# Patient Record
Sex: Male | Born: 1960 | Race: White | Hispanic: No | Marital: Single | Smoking: Never smoker
Health system: Southern US, Community
[De-identification: ages and names within clinical notes are randomized; demographics above are authoritative.]

## PROBLEM LIST (undated history)

## (undated) DIAGNOSIS — I10 Essential (primary) hypertension: Secondary | ICD-10-CM

## (undated) DIAGNOSIS — F32A Depression, unspecified: Secondary | ICD-10-CM

## (undated) DIAGNOSIS — E079 Disorder of thyroid, unspecified: Secondary | ICD-10-CM

## (undated) DIAGNOSIS — F329 Major depressive disorder, single episode, unspecified: Secondary | ICD-10-CM

## (undated) DIAGNOSIS — E785 Hyperlipidemia, unspecified: Secondary | ICD-10-CM

## (undated) DIAGNOSIS — F1010 Alcohol abuse, uncomplicated: Secondary | ICD-10-CM

## (undated) DIAGNOSIS — C801 Malignant (primary) neoplasm, unspecified: Secondary | ICD-10-CM

## (undated) DIAGNOSIS — F419 Anxiety disorder, unspecified: Secondary | ICD-10-CM

## (undated) HISTORY — DX: Depression, unspecified: F32.A

## (undated) HISTORY — DX: Essential (primary) hypertension: I10

## (undated) HISTORY — DX: Major depressive disorder, single episode, unspecified: F32.9

## (undated) HISTORY — DX: Hyperlipidemia, unspecified: E78.5

## (undated) HISTORY — PX: THYROIDECTOMY: SHX17

## (undated) HISTORY — DX: Anxiety disorder, unspecified: F41.9

---

## 1997-08-02 ENCOUNTER — Encounter: Admission: RE | Admit: 1997-08-02 | Discharge: 1997-08-02 | Payer: Self-pay | Admitting: Sports Medicine

## 1997-10-28 ENCOUNTER — Encounter: Admission: RE | Admit: 1997-10-28 | Discharge: 1997-10-28 | Payer: Self-pay | Admitting: Family Medicine

## 1997-11-24 ENCOUNTER — Encounter: Admission: RE | Admit: 1997-11-24 | Discharge: 1997-11-24 | Payer: Self-pay | Admitting: Family Medicine

## 1997-11-28 ENCOUNTER — Encounter: Admission: RE | Admit: 1997-11-28 | Discharge: 1997-11-28 | Payer: Self-pay | Admitting: Family Medicine

## 1997-11-29 ENCOUNTER — Encounter: Admission: RE | Admit: 1997-11-29 | Discharge: 1997-11-29 | Payer: Self-pay | Admitting: Family Medicine

## 1997-12-27 ENCOUNTER — Encounter: Admission: RE | Admit: 1997-12-27 | Discharge: 1997-12-27 | Payer: Self-pay | Admitting: Sports Medicine

## 1998-09-12 ENCOUNTER — Encounter: Admission: RE | Admit: 1998-09-12 | Discharge: 1998-09-12 | Payer: Self-pay | Admitting: Family Medicine

## 1998-09-14 ENCOUNTER — Encounter: Admission: RE | Admit: 1998-09-14 | Discharge: 1998-09-14 | Payer: Self-pay | Admitting: Family Medicine

## 1998-11-20 ENCOUNTER — Encounter: Admission: RE | Admit: 1998-11-20 | Discharge: 1998-11-20 | Payer: Self-pay | Admitting: Family Medicine

## 1999-08-17 ENCOUNTER — Encounter: Admission: RE | Admit: 1999-08-17 | Discharge: 1999-08-17 | Payer: Self-pay | Admitting: Family Medicine

## 1999-09-15 ENCOUNTER — Emergency Department (HOSPITAL_COMMUNITY): Admission: EM | Admit: 1999-09-15 | Discharge: 1999-09-15 | Payer: Self-pay | Admitting: Emergency Medicine

## 1999-09-16 ENCOUNTER — Encounter: Payer: Self-pay | Admitting: Emergency Medicine

## 1999-09-19 ENCOUNTER — Encounter: Admission: RE | Admit: 1999-09-19 | Discharge: 1999-09-19 | Payer: Self-pay | Admitting: Family Medicine

## 1999-11-16 ENCOUNTER — Encounter: Admission: RE | Admit: 1999-11-16 | Discharge: 2000-02-14 | Payer: Self-pay | Admitting: Internal Medicine

## 2004-05-08 ENCOUNTER — Ambulatory Visit: Payer: Self-pay | Admitting: Endocrinology

## 2004-07-20 ENCOUNTER — Ambulatory Visit: Payer: Self-pay | Admitting: Endocrinology

## 2005-01-20 ENCOUNTER — Emergency Department (HOSPITAL_COMMUNITY): Admission: EM | Admit: 2005-01-20 | Discharge: 2005-01-20 | Payer: Self-pay | Admitting: Family Medicine

## 2006-09-19 ENCOUNTER — Observation Stay (HOSPITAL_COMMUNITY): Admission: EM | Admit: 2006-09-19 | Discharge: 2006-09-20 | Payer: Self-pay | Admitting: Emergency Medicine

## 2007-01-24 ENCOUNTER — Emergency Department (HOSPITAL_COMMUNITY): Admission: EM | Admit: 2007-01-24 | Discharge: 2007-01-24 | Payer: Self-pay | Admitting: Emergency Medicine

## 2007-02-22 ENCOUNTER — Emergency Department (HOSPITAL_COMMUNITY): Admission: EM | Admit: 2007-02-22 | Discharge: 2007-02-22 | Payer: Self-pay | Admitting: Family Medicine

## 2008-07-21 ENCOUNTER — Emergency Department (HOSPITAL_COMMUNITY): Admission: EM | Admit: 2008-07-21 | Discharge: 2008-07-21 | Payer: Self-pay | Admitting: Emergency Medicine

## 2008-09-04 ENCOUNTER — Emergency Department (HOSPITAL_COMMUNITY): Admission: EM | Admit: 2008-09-04 | Discharge: 2008-09-04 | Payer: Self-pay | Admitting: Emergency Medicine

## 2008-09-08 ENCOUNTER — Emergency Department (HOSPITAL_COMMUNITY): Admission: EM | Admit: 2008-09-08 | Discharge: 2008-09-08 | Payer: Self-pay | Admitting: Family Medicine

## 2008-09-10 ENCOUNTER — Emergency Department (HOSPITAL_COMMUNITY): Admission: EM | Admit: 2008-09-10 | Discharge: 2008-09-10 | Payer: Self-pay | Admitting: Emergency Medicine

## 2008-10-21 ENCOUNTER — Emergency Department (HOSPITAL_COMMUNITY): Admission: EM | Admit: 2008-10-21 | Discharge: 2008-10-21 | Payer: Self-pay | Admitting: Emergency Medicine

## 2010-01-28 ENCOUNTER — Encounter: Payer: Self-pay | Admitting: Internal Medicine

## 2010-05-22 NOTE — H&P (Signed)
NAME:  Richard Duarte, Richard Duarte NO.:  [id number]   MEDICAL RECORD NO.:  [id number]          PATIENT TYPE:  EMS   LOCATION:  MAJO                         FACILITY:  [facility]   PHYSICIAN:  Mobolaji B. Bakare, M.D.DATE OF BIRTH:  [birth year]   DATE OF ADMISSION:  09/19/2006  DATE OF DISCHARGE:                              HISTORY & PHYSICAL   PRIMARY CARE PHYSICIAN:  Robert P. Merla Riches, M.D., Urgent [facility].   CHIEF COMPLAINT:  Bilateral thigh cramps and weakness.   HISTORY OF PRESENT ILLNESS:  Mr. Maye is a 50 year old Caucasian  male who was in his usual state of health until late last night at about  12 midnight when he developed cramps in both thighs.  It felt like a  Charlie horse.  He was playing video games at that time.  These cramps  were not getting any better.  He attempted to walk.  He had difficulty  standing up from a sitting position.  The patient decided to come to the  emergency room early this morning.   He had an MRI of his lumbar spine which showed very small right  paracentral herniated nucleus pulposus involving L5-S1.  There was no  root compression.  Laboratory workup done so far showed hypokalemia of  2.7.  He has a normal AST and ALT, mildly elevated creatinine kinase of  280.  The patient has not had similar problem in the past.  He has not  had diarrhea, vomiting.  He saw Dr. Merla Riches 10 days ago, and he drew  some blood work which he has not received the results of as of yet.  The  patient has been seen by Dr. Sandria Manly, neurologist.   REVIEW OF SYSTEMS:  There is no fever, chills, shortness of breath,  chest pain, cough, nausea, vomiting, diarrhea.  No dysuria, urgency, or  increased frequency of micturition.   PAST MEDICAL HISTORY:  1. Thyroidectomy in 1993 for thyroid cancer.  He has subsequently been      on thyroid replacement therapy.  2. Hypertension.  3. Hyperlipidemia.  4. Morbid obesity.  5. The patient has been taken off  antihypertensive and cholesterol      medications since he lost 85 pounds since October of 2007.  He was      Atkins diet and exercise.  Currently the patient is no longer      exercising regularly.   CURRENT MEDICATIONS:  Synthroid.  The patient cannot recall the dose.   ALLERGIES:  PENICILLIN.   FAMILY HISTORY:  Father is deceased possibly from heart trouble.  Mother  is alive.  He has one other sister sibling.   SOCIAL HISTORY:  He does not smoke cigarettes.  He states that he drinks  alcohol in moderation.  He is unemployed at this time.  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   PHYSICAL EXAMINATION:  VITAL SIGNS:  Initial vitals revealed a  temperature of 97.3, blood pressure 139/98, pulse 95, respiratory rate  22, O2 saturation 98%.  GENERAL:  The patient is awake, alert, and oriented to time,  place, and  person.  HEENT:  Normocephalic, atraumatic.  Pupils are equal, round, and  reactive to light.  Extraocular muscle movements intact.  Mucous  membranes moist.  NECK:  No elevated JVD.  No carotid bruit.  LUNGS:  Clear clinically to auscultation.  CVS:  S1 and S2 regular.  No murmur, no gallop.  ABDOMEN:  Not distended, soft, nontender.  Bowel sounds present.  EXTREMITIES:  No pedal edema or calf tenderness.  CNS:  Motor power 5/5 in both upper limbs, 4/5 involving the quadriceps  bilaterally.  There is no loss of sensation.  Cranial nerves are intact.  The patient was able to ambulate but slowly.  Coordination appears to be  intact.   INITIAL LABORATORY DATA:  Potassium 2.7.  CK 280, ALT 25.  White cells  8.0, hemoglobin 14.7, hematocrit 41.9, MCV 85.4, platelets 199, normal  differential.  Sodium 142, chloride 109, glucose __________, creatinine  1.0, bicarb 23.   ASSESSMENT AND PLAN:  Mr. Mergen is a pleasant 50 year old Caucasian  male presenting with muscle cramps and weakness involving both thighs.  He was noted to have  hypokalemia of 2.7.  MRI of the lumbar spine showed  very small right herniated nucleus pulposus which I do not think is  relevant to current symptoms.  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  The etiology of hypokalemia is unclear.   ADMISSION DIAGNOSES:  1. Hypokalemia.  2. Proximal myopathy.  3. Hypothyroidism.   PLAN:  The patient has received 20 mEq of potassium IV and 40 mEq p.o.  Will repeat potassium now and also check magnesium.  Will continue to  replete potassium as necessary.  Will ask physical therapist to evaluate  and see for improvement in symptoms after repleting potassium.  Will  observe the patient on telemetry.      Mobolaji B. Corky Downs, M.D.  Electronically Signed     MBB/MEDQ  D:  09/19/2006  T:  09/19/2006  Job:  [id number]

## 2010-05-22 NOTE — Consult Note (Signed)
NAME:  Richard Duarte, Richard Duarte NO.:  [id number]   MEDICAL RECORD NO.:  [id number]          PATIENT TYPE:  EMS   LOCATION:  MAJO                         FACILITY:  [facility]   PHYSICIAN:  Richard Duarte, M.D.    DATE OF BIRTH:  16-Jun-1960   DATE OF CONSULTATION:  09/19/2006  DATE OF DISCHARGE:                                 CONSULTATION   PRIMARY CARE PHYSICIAN:  Richard Duarte, M.D.   This 50 year old right-handed white single male is seen in the emergency  room at [facility] on September 19, 2006 for evaluation of  bilateral lower extremity proximal pain and weakness.   HISTORY OF PRESENT ILLNESS:  Richard Duarte has a prior history of  thyroid cancer in 1988 and underwent removal followed by radiation  therapy and chemotherapy.  He has been on Synthroid replacement 0.15 mg  daily but no other medications.  He has been losing weight at his  physician's request and has lost approximately 85 pounds over the last  year.  He has been a low-carbohydrate diet but does drink two glass of  wine every other day.  On the evening of September 17, 2006 to celebrate  a friend's birthday, he did eat a high carbohydrate load.  On the  evening/morning of September 17, 2006 to September 18, 2006, he noted  the onset of pain proximally in his thighs when he stood up.  This was a  severe cramping pain, lasting minutes to as long as 1 hour.  Since that  time, has noted weakness and difficulty in standing and walking in his  lower extremities.  He has had no neck pain radiating into his arms nor  has he had any lower back pain radiating to his legs.  He denies any  numbness or bowel or bladder dysfunction.  He has no family history of  neuromyopathic disease.  He denies any use of drugs.  As mentioned, he  does drink two glass of wine every other day.   ALLERGIES:  PENICILLIN.   SOCIAL HISTORY:  He is single.  He has no children.  He was educated  with a BA degree in [place].   He is currently unemployed.   FAMILY HISTORY:  His mother is 27 and has Alzheimer's disease and  otherwise is in good health.  His father died at age 61 from myocardial  infarction.  He has one sister who is 9, living and well.   OBJECTIVE:  GENERAL:  Well-developed, mildly obese white male.  VITAL SIGNS:  Blood pressure in the right and left arm was 120/80, heart  rate 64.  NECK:  There were no carotid or supraclavicular bruits heard.  The neck  flexion/extension maneuvers were relatively unremarkable.  NEUROLOGIC:  Mental status:  He was alert and oriented x3.  His cranial nerve  examination revealed visual fields to be full.  Both disks were seen and  flat.  The extraocular movements were full.  Corneals were present.  There was no seventh nerve palsy.  Hearing was present.  Air conduction  was greater than bone conduction.  Tongue was midline.  The uvula was  midline.  Gags were present.  Sternocleidomastoid and trapezius testing  were normal.  Motor examination revealed some giving way phenomenon in  the triceps muscles bilaterally secondary to pain, possibly in the 4+/5  range in terms of strength.  Otherwise, strength testing in the upper  extremities was quite good.  In the lower extremities, he had weakness  in the iliopsoas bilaterally in the 4-/5 range, some weakness in the  hamstrings bilaterally and in the gluteus maximus bilaterally.  The  gluteus medius and distal muscle strength in the lower extremities  appeared to be quite good.  Deep tendon reflexes were 2+ in the upper  and lower extremities.  Plantar responses were downgoing.  Sensory  examination was intact to pinprick touch.  Normal position and vibration  testing.  His two-point discrimination in the hands was equal.  Gait was  not evaluated.   LABORATORY DATA:  White blood cell count of 8000, hemoglobin 14.7,  hematocrit 41.9, platelet count 199 K.  Sodium 142, potassium 2.8,  chloride 109, BUN 6, glucose  134.  His pH was 7.437.  CK total was 280,  SGPT was 25.   IMPRESSION:  1. Suspect myopathy most likely secondary to hypokalemia, code 359.8.  2. Hypokalemia, code 276.8.  3. History of thyroid cancer, code 193.   RECOMMENDATION:  Recommendations at this time is if MRI study of lumbar  spine is unremarkable to replace his potassium.           ______________________________  Richard Duarte, M.D.     JML/MEDQ  D:  09/19/2006  T:  09/19/2006  Job:  [id number]   cc:   Richard Duarte, M.D.  Richard Duarte, M.D.

## 2010-07-06 ENCOUNTER — Emergency Department (HOSPITAL_COMMUNITY)
Admission: EM | Admit: 2010-07-06 | Discharge: 2010-07-07 | Discharge status: Against Medical Advice | Payer: BC Managed Care – PPO | Attending: Emergency Medicine | Admitting: Emergency Medicine

## 2010-07-06 DIAGNOSIS — Z0389 Encounter for observation for other suspected diseases and conditions ruled out: Secondary | ICD-10-CM | POA: Insufficient documentation

## 2010-09-27 LAB — I-STAT 8, (EC8 V) (CONVERTED LAB)
Acid-base deficit: 1
BUN: 14
Bicarbonate: 22.1
Chloride: 106
Glucose, Bld: 105 — ABNORMAL HIGH
HCT: 49
Hemoglobin: 16.7
Operator id: 270961
Potassium: 3.6
Sodium: 139
TCO2: 23
pCO2, Ven: 33 — ABNORMAL LOW
pH, Ven: 7.434 — ABNORMAL HIGH

## 2010-09-27 LAB — POCT I-STAT CREATININE
Creatinine, Ser: 1
Operator id: 270961

## 2010-10-06 ENCOUNTER — Inpatient Hospital Stay (INDEPENDENT_AMBULATORY_CARE_PROVIDER_SITE_OTHER)
Admission: RE | Admit: 2010-10-06 | Discharge: 2010-10-06 | Disposition: A | Discharge status: Home / Self Care | Payer: BC Managed Care – PPO | Source: Ambulatory Visit | Attending: Family Medicine | Admitting: Family Medicine

## 2010-10-06 DIAGNOSIS — F411 Generalized anxiety disorder: Secondary | ICD-10-CM

## 2010-10-06 DIAGNOSIS — E870 Hyperosmolality and hypernatremia: Secondary | ICD-10-CM

## 2010-10-06 DIAGNOSIS — R002 Palpitations: Secondary | ICD-10-CM

## 2010-10-06 LAB — POCT I-STAT, CHEM 8
BUN: 18 mg/dL (ref 6–23)
Calcium, Ion: 1.13 mmol/L (ref 1.12–1.32)
Chloride: 102 mEq/L (ref 96–112)
Creatinine, Ser: 1 mg/dL (ref 0.50–1.35)
Glucose, Bld: 84 mg/dL (ref 70–99)
HCT: 57 % — ABNORMAL HIGH (ref 39.0–52.0)
Hemoglobin: 19.4 g/dL — ABNORMAL HIGH (ref 13.0–17.0)
Potassium: 4.2 mEq/L (ref 3.5–5.1)
Sodium: 157 mEq/L — ABNORMAL HIGH (ref 135–145)
TCO2: 18 mmol/L (ref 0–100)

## 2010-10-07 ENCOUNTER — Emergency Department (HOSPITAL_COMMUNITY): Payer: BC Managed Care – PPO

## 2010-10-07 ENCOUNTER — Emergency Department (HOSPITAL_COMMUNITY)
Admission: EM | Admit: 2010-10-07 | Discharge: 2010-10-07 | Disposition: A | Discharge status: Home / Self Care | Payer: BC Managed Care – PPO | Attending: Emergency Medicine | Admitting: Emergency Medicine

## 2010-10-07 DIAGNOSIS — E860 Dehydration: Secondary | ICD-10-CM | POA: Insufficient documentation

## 2010-10-07 DIAGNOSIS — I10 Essential (primary) hypertension: Secondary | ICD-10-CM | POA: Insufficient documentation

## 2010-10-07 DIAGNOSIS — E785 Hyperlipidemia, unspecified: Secondary | ICD-10-CM | POA: Insufficient documentation

## 2010-10-07 DIAGNOSIS — R002 Palpitations: Secondary | ICD-10-CM | POA: Insufficient documentation

## 2010-10-07 DIAGNOSIS — E039 Hypothyroidism, unspecified: Secondary | ICD-10-CM | POA: Insufficient documentation

## 2010-10-07 DIAGNOSIS — Z85850 Personal history of malignant neoplasm of thyroid: Secondary | ICD-10-CM | POA: Insufficient documentation

## 2010-10-07 DIAGNOSIS — F411 Generalized anxiety disorder: Secondary | ICD-10-CM | POA: Insufficient documentation

## 2010-10-07 LAB — URINALYSIS, ROUTINE W REFLEX MICROSCOPIC
Bilirubin Urine: NEGATIVE
Glucose, UA: NEGATIVE mg/dL
Ketones, ur: >80 mg/dL — AB
Protein, ur: NEGATIVE mg/dL
pH: 5.5 (ref 5.0–8.0)

## 2010-10-07 LAB — POCT I-STAT, CHEM 8
BUN: 24 mg/dL — ABNORMAL HIGH (ref 6–23)
Creatinine, Ser: 1 mg/dL (ref 0.50–1.35)
Glucose, Bld: 102 mg/dL — ABNORMAL HIGH (ref 70–99)
Hemoglobin: 16 g/dL (ref 13.0–17.0)
Potassium: 3.7 mEq/L (ref 3.5–5.1)
TCO2: 22 mmol/L (ref 0–100)

## 2010-10-07 LAB — URINE MICROSCOPIC-ADD ON

## 2010-10-07 LAB — CBC
HCT: 44.8 % (ref 39.0–52.0)
Hemoglobin: 16.1 g/dL (ref 13.0–17.0)
MCH: 30.1 pg (ref 26.0–34.0)
MCHC: 35.9 g/dL (ref 30.0–36.0)
MCV: 83.9 fL (ref 78.0–100.0)
RDW: 12.6 % (ref 11.5–15.5)

## 2010-10-07 LAB — DIFFERENTIAL
Basophils Absolute: 0 10*3/uL (ref 0.0–0.1)
Eosinophils Relative: 2 % (ref 0–5)
Lymphocytes Relative: 32 % (ref 12–46)
Monocytes Absolute: 0.6 10*3/uL (ref 0.1–1.0)
Monocytes Relative: 9 % (ref 3–12)

## 2010-10-19 LAB — CBC
Hemoglobin: 14.7
MCHC: 35.2
Platelets: 214
RBC: 4.91
RDW: 13.5
WBC: 7.1
WBC: 8

## 2010-10-19 LAB — PHOSPHORUS
Phosphorus: 2.2 — ABNORMAL LOW
Phosphorus: 2.3

## 2010-10-19 LAB — SEDIMENTATION RATE: Sed Rate: 10

## 2010-10-19 LAB — I-STAT 8, (EC8 V) (CONVERTED LAB)
Acid-base deficit: 1
Bicarbonate: 22.7
Glucose, Bld: 134 — ABNORMAL HIGH
Hemoglobin: 15
Potassium: 2.8 — ABNORMAL LOW
Sodium: 142
TCO2: 24
pH, Ven: 7.437 — ABNORMAL HIGH

## 2010-10-19 LAB — TSH: TSH: 2.521

## 2010-10-19 LAB — DIFFERENTIAL
Eosinophils Absolute: 0.1
Lymphs Abs: 1.5
Monocytes Relative: 6
Neutro Abs: 5.9
Neutrophils Relative %: 74

## 2010-10-19 LAB — POCT I-STAT CREATININE
Creatinine, Ser: 1
Operator id: 294501

## 2010-10-19 LAB — ALT: ALT: 25

## 2010-10-19 LAB — MAGNESIUM: Magnesium: 1.7

## 2010-10-19 LAB — CK: Total CK: 280 — ABNORMAL HIGH

## 2011-05-11 ENCOUNTER — Emergency Department (HOSPITAL_COMMUNITY)
Admission: EM | Admit: 2011-05-11 | Discharge: 2011-05-11 | Disposition: A | Discharge status: Home / Self Care | Payer: BC Managed Care – PPO | Source: Home / Self Care

## 2011-05-11 NOTE — ED Notes (Signed)
Pt is orient x 3 and speaking full sentences without any difficulty breathing. Pt stated he started having chest pain with driving to [facility]. However, Pt states he has no chest pain at the current time and has no cardiac history. Pt is coming to today due to taking his own BP pressure at home and getting low readings of  Systolic 90. Pt returned to waiting room to wait his turn to be called and informed if anything else changes to let us know.

## 2011-11-17 ENCOUNTER — Encounter (HOSPITAL_COMMUNITY): Payer: Self-pay | Admitting: *Deleted

## 2011-11-17 ENCOUNTER — Emergency Department (HOSPITAL_COMMUNITY)
Admission: EM | Admit: 2011-11-17 | Discharge: 2011-11-17 | Disposition: A | Discharge status: Home / Self Care | Payer: BC Managed Care – PPO | Source: Home / Self Care | Attending: Family Medicine | Admitting: Family Medicine

## 2011-11-17 DIAGNOSIS — F419 Anxiety disorder, unspecified: Secondary | ICD-10-CM

## 2011-11-17 DIAGNOSIS — F411 Generalized anxiety disorder: Secondary | ICD-10-CM

## 2011-11-17 DIAGNOSIS — T180XXA Foreign body in mouth, initial encounter: Secondary | ICD-10-CM

## 2011-11-17 HISTORY — DX: Malignant (primary) neoplasm, unspecified: C80.1

## 2011-11-17 HISTORY — DX: Disorder of thyroid, unspecified: E07.9

## 2011-11-17 HISTORY — DX: Alcohol abuse, uncomplicated: F10.10

## 2011-11-17 MED ORDER — LORAZEPAM 0.5 MG PO TABS: 0.5000 mg | ORAL_TABLET | Freq: Three times a day (TID) | ORAL | Status: DC | PRN

## 2011-11-17 NOTE — ED Provider Notes (Signed)
History     CSN: [id number]  Arrival date & time 11/17/11  1478   First MD Initiated Contact with Patient 11/17/11 [phone]      Chief Complaint  Patient presents with  . Ingestion    (Consider location/radiation/quality/duration/timing/severity/associated sxs/prior treatment) HPI Comments: 51 year old male with history of anxiety and alcoholism here complaining of a biter taste in his mouth after biting a capsule that was inserted in in his breakfast this morning. Patient reports that he eats breakfast from same [employer] every day. He took his food home and was eating his breakfast table when he bit into a liquid gel capsule and the content was spilled in his mouth causing him to have a very bitter taste. He immediately spit out all mouth content. He was able to see a cream rubber capsule and some brown liquid coming out of it. After this incident he went to the [employer] file a complaint and then came here. He still has some residual bitter taste in his mouth. Feels anxious, denies nausea although he's been trying to induce vomit himself. Denies enies any other symptoms like salivation, sweating, dizziness, headache, blurred vision, chest discomfort, difficulty breathing, abdominal pain, vomiting or diarrhea. Also no rash or flushing.   Past Medical History  Diagnosis Date  . Thyroid disease   . Cancer     Thyroid  . Alcohol abuse, daily use     Past Surgical History  Procedure Date  . Thyroidectomy     No family history on file.  History  Substance Use Topics  . Smoking status: Never Smoker   . Smokeless tobacco: Not on file  . Alcohol Use: No     Comment: Quit      Review of Systems  Constitutional: Negative for fever, chills, diaphoresis, activity change, appetite change and fatigue.  HENT: Negative for congestion, sore throat, facial swelling, sneezing, drooling and trouble swallowing.   Eyes: Negative for redness, itching and visual disturbance.    Respiratory: Negative for cough, chest tightness, shortness of breath and wheezing.   Cardiovascular: Negative for chest pain.  Gastrointestinal: Negative for nausea, vomiting, abdominal pain and diarrhea.  Musculoskeletal: Negative for myalgias and arthralgias.  Skin: Negative for rash.  Neurological: Negative for dizziness and headaches.  Psychiatric/Behavioral: The patient is nervous/anxious.     Allergies  Penicillins  Home Medications   Current Outpatient Rx  Name  Route  Sig  Dispense  Refill  . VITAMIN C PO   Oral   Take by mouth.         . ASPIRIN 81 MG PO TABS   Oral   Take 81 mg by mouth daily.         Marland Kitchen LEVOTHYROXINE SODIUM 175 MCG PO TABS   Oral   Take 175 mcg by mouth daily.         Marland Kitchen MAGNESIUM PO   Oral   Take by mouth.         Marland Kitchen MILK THISTLE EXTRACT PO   Oral   Take by mouth.         . ONE-DAILY MULTI VITAMINS PO TABS   Oral   Take 1 tablet by mouth daily.         Marland Kitchen ZINC PO   Oral   Take by mouth.         . RED YEAST RICE PO   Oral   Take by mouth.         Marland Kitchen LORAZEPAM 0.5 MG  PO TABS   Oral   Take 1 tablet (0.5 mg total) by mouth every 8 (eight) hours as needed for anxiety.   6 tablet   0     BP 146/97  Pulse 115  Temp 98 F (36.7 C) (Oral)  Resp 26  SpO2 97%  Physical Exam  Nursing note and vitals reviewed. Constitutional: He is oriented to person, place, and time. He appears well-developed and well-nourished.       Appears anxious  HENT:  Head: Normocephalic and atraumatic.  Mouth/Throat: Oropharynx is clear and moist. No oropharyngeal exudate.       No discoloration of the teeth, tongue or mouth mucosa  Eyes: Conjunctivae normal and EOM are normal. Pupils are equal, round, and reactive to light. Right eye exhibits no discharge. Left eye exhibits no discharge. No scleral icterus.  Neck: Neck supple.  Cardiovascular: Normal rate, regular rhythm and normal heart sounds.        Tachycardic  Pulmonary/Chest:  Effort normal and breath sounds normal. No respiratory distress. He has no wheezes. He has no rales. He exhibits no tenderness.       Patient was breathing comfortably with normal respiratory rate during my exam.  Abdominal: Soft. He exhibits no distension. There is no tenderness.  Neurological: He is alert and oriented to person, place, and time.  Skin: No rash noted. He is not diaphoretic.  Psychiatric: He has a normal mood and affect. His behavior is normal. Judgment and thought content normal.    ED Course  Procedures (including critical care time)  Labs Reviewed - No data to display No results found.   1. Anxiety   2. Foreign body in mouth       MDM  Discussed patient's case with poison control. The medications that come in liquid gel form (moslty cold remedies) in [place] does not poise a life threat to the patient in question even if the capsule was fully ingested.  The capsule that the patient ingested does not have any obvious prints. It is possible there is a no labeled multivitamin or herbal supplement or a toy. There there are no clinical symptoms or findings suggestive of acute poison.   poison control does not recommend any testing at this point.   patient was instructed to go to the emergency department if new symptoms including but not limited to salivation, nausea, vomiting, dizziness, headache, difficulty breathing, abdominal or chest discomfort, feeling flushed or rashes. I prescribed Ativan 0.5 mg 3 times a day when necessary for anxiety.         Sharin Grave, MD 11/18/11 1332

## 2011-11-17 NOTE — ED Notes (Addendum)
States was eating a breakfast bowl from [employer] at home when he suddenly bit into a liquigel that came from the egg - immediately had very bitter taste in his mouth, spit out the dark green oblong casing.  Went to [facility], but Production designer, theatre/television/film had no answers.  Patient states he's very anxious about this - patient shaky and visibly anxious, continues to try to vomit - told to no longer do so.  States has not had anything to eat or drink since incident.  Reports slight HA, "But that's normal for me this time of year".  Denies any other c/o's.  Large dark green casing without any markings noted in bowl.

## 2012-11-03 ENCOUNTER — Ambulatory Visit
Admission: RE | Admit: 2012-11-03 | Discharge: 2012-11-03 | Disposition: A | Discharge status: Home / Self Care | Payer: BC Managed Care – PPO | Source: Ambulatory Visit | Attending: Nurse Practitioner | Admitting: Nurse Practitioner

## 2012-11-03 ENCOUNTER — Other Ambulatory Visit: Payer: Self-pay | Admitting: Nurse Practitioner

## 2012-11-03 DIAGNOSIS — M545 Low back pain, unspecified: Secondary | ICD-10-CM

## 2013-04-30 ENCOUNTER — Other Ambulatory Visit: Payer: Self-pay | Admitting: Internal Medicine

## 2013-04-30 ENCOUNTER — Ambulatory Visit
Admission: RE | Admit: 2013-04-30 | Discharge: 2013-04-30 | Disposition: A | Discharge status: Home / Self Care | Payer: BC Managed Care – PPO | Source: Ambulatory Visit | Attending: Internal Medicine | Admitting: Internal Medicine

## 2013-04-30 DIAGNOSIS — R079 Chest pain, unspecified: Secondary | ICD-10-CM

## 2014-03-20 ENCOUNTER — Encounter (HOSPITAL_COMMUNITY): Payer: Self-pay | Admitting: Emergency Medicine

## 2014-03-20 ENCOUNTER — Emergency Department (HOSPITAL_COMMUNITY)
Admission: EM | Admit: 2014-03-20 | Discharge: 2014-03-20 | Disposition: A | Discharge status: Home / Self Care | Payer: BLUE CROSS/BLUE SHIELD | Source: Home / Self Care | Attending: Family Medicine | Admitting: Family Medicine

## 2014-03-20 DIAGNOSIS — J40 Bronchitis, not specified as acute or chronic: Secondary | ICD-10-CM

## 2014-03-20 MED ORDER — PREDNISONE 10 MG PO TABS: 30.0000 mg | ORAL_TABLET | Freq: Every day | ORAL | Status: DC

## 2014-03-20 MED ORDER — IPRATROPIUM BROMIDE 0.06 % NA SOLN: 2.0000 | Freq: Four times a day (QID) | NASAL | Status: DC

## 2014-03-20 MED ORDER — AZITHROMYCIN 250 MG PO TABS
250.0000 mg | ORAL_TABLET | Freq: Every day | ORAL | Status: DC
Start: 2014-03-20 — End: 2016-01-14

## 2014-03-20 MED ORDER — HYDROCOD POLST-CHLORPHEN POLST 10-8 MG/5ML PO LQCR: 5.0000 mL | Freq: Two times a day (BID) | ORAL | Status: DC | PRN

## 2014-03-20 NOTE — Discharge Instructions (Signed)
Thank you for coming in today. Call or go to the emergency room if you get worse, have trouble breathing, have chest pains, or palpitations.  Do not drive after taking cough medicine.  Return as needed.  Take the azithromycin antibiotics if not better.   Acute Bronchitis Bronchitis is inflammation of the airways that extend from the windpipe into the lungs (bronchi). The inflammation often causes mucus to develop. This leads to a cough, which is the most common symptom of bronchitis.  In acute bronchitis, the condition usually develops suddenly and goes away over time, usually in a couple weeks. Smoking, allergies, and asthma can make bronchitis worse. Repeated episodes of bronchitis may cause further lung problems.  CAUSES Acute bronchitis is most often caused by the same virus that causes a cold. The virus can spread from person to person (contagious) through coughing, sneezing, and touching contaminated objects. SIGNS AND SYMPTOMS   Cough.   Fever.   Coughing up mucus.   Body aches.   Chest congestion.   Chills.   Shortness of breath.   Sore throat.  DIAGNOSIS  Acute bronchitis is usually diagnosed through a physical exam. Your health care provider will also ask you questions about your medical history. Tests, such as chest X-rays, are sometimes done to rule out other conditions.  TREATMENT  Acute bronchitis usually goes away in a couple weeks. Oftentimes, no medical treatment is necessary. Medicines are sometimes given for relief of fever or cough. Antibiotic medicines are usually not needed but may be prescribed in certain situations. In some cases, an inhaler may be recommended to help reduce shortness of breath and control the cough. A cool mist vaporizer may also be used to help thin bronchial secretions and make it easier to clear the chest.  HOME CARE INSTRUCTIONS  Get plenty of rest.   Drink enough fluids to keep your urine clear or pale yellow (unless you have  a medical condition that requires fluid restriction). Increasing fluids may help thin your respiratory secretions (sputum) and reduce chest congestion, and it will prevent dehydration.   Take medicines only as directed by your health care provider.  If you were prescribed an antibiotic medicine, finish it all even if you start to feel better.  Avoid smoking and secondhand smoke. Exposure to cigarette smoke or irritating chemicals will make bronchitis worse. If you are a smoker, consider using nicotine gum or skin patches to help control withdrawal symptoms. Quitting smoking will help your lungs heal faster.   Reduce the chances of another bout of acute bronchitis by washing your hands frequently, avoiding people with cold symptoms, and trying not to touch your hands to your mouth, nose, or eyes.   Keep all follow-up visits as directed by your health care provider.  SEEK MEDICAL CARE IF: Your symptoms do not improve after 1 week of treatment.  SEEK IMMEDIATE MEDICAL CARE IF:  You develop an increased fever or chills.   You have chest pain.   You have severe shortness of breath.  You have bloody sputum.   You develop dehydration.  You faint or repeatedly feel like you are going to pass out.  You develop repeated vomiting.  You develop a severe headache. MAKE SURE YOU:   Understand these instructions.  Will watch your condition.  Will get help right away if you are not doing well or get worse. Document Released: 02/01/2004 Document Revised: 05/10/2013 Document Reviewed: 06/16/2012 [facility] Patient Information 2015 [place], [place]. This information is not intended to  replace advice given to you by your health care provider. Make sure you discuss any questions you have with your health care provider. ° °

## 2014-03-20 NOTE — ED Notes (Signed)
C/o  A prolonged cough since 3/4.  Pt states that symptoms started on 3/4 with sore throat and runny noses.   On Sunday cough started.  Monday was seen by primary and given cough meds, but is having no relief.  Cough is nonproductive.  Denies fever n/v/d.  No chest pain or sob.

## 2014-03-20 NOTE — ED Provider Notes (Signed)
Richard Duarte is a 54 y.o. male who presents to Urgent Care today for cough. Patient notes a 1 week history of cough and congestion. He was seen by his PCP 6 days ago and was prescribed tessonex which has helped. He notes that the cough continues. The cough is minimally productive. The cough is quite bothersome despite cough syrup. He notes runny nose as well. No vomiting diarrhea chest pain or palpitations. No significant shortness of breath.   Past Medical History  Diagnosis Date  . Thyroid disease   . Cancer     Thyroid  . Alcohol abuse, daily use    Past Surgical History  Procedure Laterality Date  . Thyroidectomy     History  Substance Use Topics  . Smoking status: Never Smoker   . Smokeless tobacco: Not on file  . Alcohol Use: No     Comment: Quit   ROS as above Medications: No current facility-administered medications for this encounter.   Current Outpatient Prescriptions  Medication Sig Dispense Refill  . HYDROcodone-acetaminophen (NORCO/VICODIN) 5-325 MG per tablet Take 1 tablet by mouth every 6 (six) hours as needed for moderate pain.    Marland Kitchen levothyroxine (SYNTHROID, LEVOTHROID) 175 MCG tablet Take 175 mcg by mouth daily.    . Ascorbic Acid (VITAMIN C PO) Take by mouth.    Marland Kitchen aspirin 81 MG tablet Take 81 mg by mouth daily.    Marland Kitchen azithromycin (ZITHROMAX) 250 MG tablet Take 1 tablet (250 mg total) by mouth daily. Take first 2 tablets together, then 1 every day until finished. 6 tablet 0  . chlorpheniramine-HYDROcodone (TUSSIONEX) 10-8 MG/5ML LQCR Take 5 mLs by mouth every 12 (twelve) hours as needed for cough. 115 mL 0  . ipratropium (ATROVENT) 0.06 % nasal spray Place 2 sprays into both nostrils 4 (four) times daily. 15 mL 1  . LORazepam (ATIVAN) 0.5 MG tablet Take 1 tablet (0.5 mg total) by mouth every 8 (eight) hours as needed for anxiety. 6 tablet 0  . MAGNESIUM PO Take by mouth.    Marland Kitchen MILK THISTLE EXTRACT PO Take by mouth.    . Multiple Vitamin (MULTIVITAMIN) tablet  Take 1 tablet by mouth daily.    . Multiple Vitamins-Minerals (ZINC PO) Take by mouth.    . predniSONE (DELTASONE) 10 MG tablet Take 3 tablets (30 mg total) by mouth daily. 15 tablet 0  . Red Yeast Rice Extract (RED YEAST RICE PO) Take by mouth.     Allergies  Allergen Reactions  . Amoxicillin   . Keflex [Cephalexin] Hives  . Penicillins      Exam:  BP 136/88 mmHg  Pulse 90  Temp(Src) 98.2 F (36.8 C) (Oral)  Resp 18  SpO2 95% Gen: Well NAD HEENT: EOMI,  MMM clear nasal discharge. Posterior pharynx cobblestoning. Normal tympanic membranes bilaterally. Lungs: Normal work of breathing. CTABL Heart: RRR no MRG Abd: NABS, Soft. Nondistended, Nontender Exts: Brisk capillary refill, warm and well perfused.   No results found for this or any previous visit (from the past 24 hour(s)). No results found.  Assessment and Plan: 54 y.o. male with bronchitis likely viral. Treat with prednisone, Atrovent nasal spray, Tussionex. Use azithromycin if not improving or worsening.  Discussed warning signs or symptoms. Please see discharge instructions. Patient expresses understanding.     Gregor Hams, MD 03/20/14 [phone]

## 2014-05-05 ENCOUNTER — Other Ambulatory Visit: Payer: Self-pay | Admitting: Internal Medicine

## 2014-05-05 ENCOUNTER — Ambulatory Visit
Admission: RE | Admit: 2014-05-05 | Discharge: 2014-05-05 | Disposition: A | Discharge status: Home / Self Care | Payer: BLUE CROSS/BLUE SHIELD | Source: Ambulatory Visit | Attending: Internal Medicine | Admitting: Internal Medicine

## 2014-05-05 DIAGNOSIS — M25461 Effusion, right knee: Secondary | ICD-10-CM

## 2015-04-13 DIAGNOSIS — J069 Acute upper respiratory infection, unspecified: Secondary | ICD-10-CM | POA: Diagnosis not present

## 2015-07-26 DIAGNOSIS — E890 Postprocedural hypothyroidism: Secondary | ICD-10-CM | POA: Diagnosis not present

## 2015-07-26 DIAGNOSIS — Z85850 Personal history of malignant neoplasm of thyroid: Secondary | ICD-10-CM | POA: Diagnosis not present

## 2015-07-28 DIAGNOSIS — Z85850 Personal history of malignant neoplasm of thyroid: Secondary | ICD-10-CM | POA: Diagnosis not present

## 2015-07-28 DIAGNOSIS — E890 Postprocedural hypothyroidism: Secondary | ICD-10-CM | POA: Diagnosis not present

## 2015-09-14 DIAGNOSIS — Z85850 Personal history of malignant neoplasm of thyroid: Secondary | ICD-10-CM | POA: Diagnosis not present

## 2015-09-14 DIAGNOSIS — E890 Postprocedural hypothyroidism: Secondary | ICD-10-CM | POA: Diagnosis not present

## 2015-11-12 IMAGING — CR DG KNEE COMPLETE 4+V*R*
3 series · 3 of 3 positions shown · non-contrast
Comparison: None.

CLINICAL DATA: Pain following fall 1 week prior. Swelling and
bruising medially

EXAM:
RIGHT KNEE - COMPLETE 4+ VIEW

[w knee ap left]
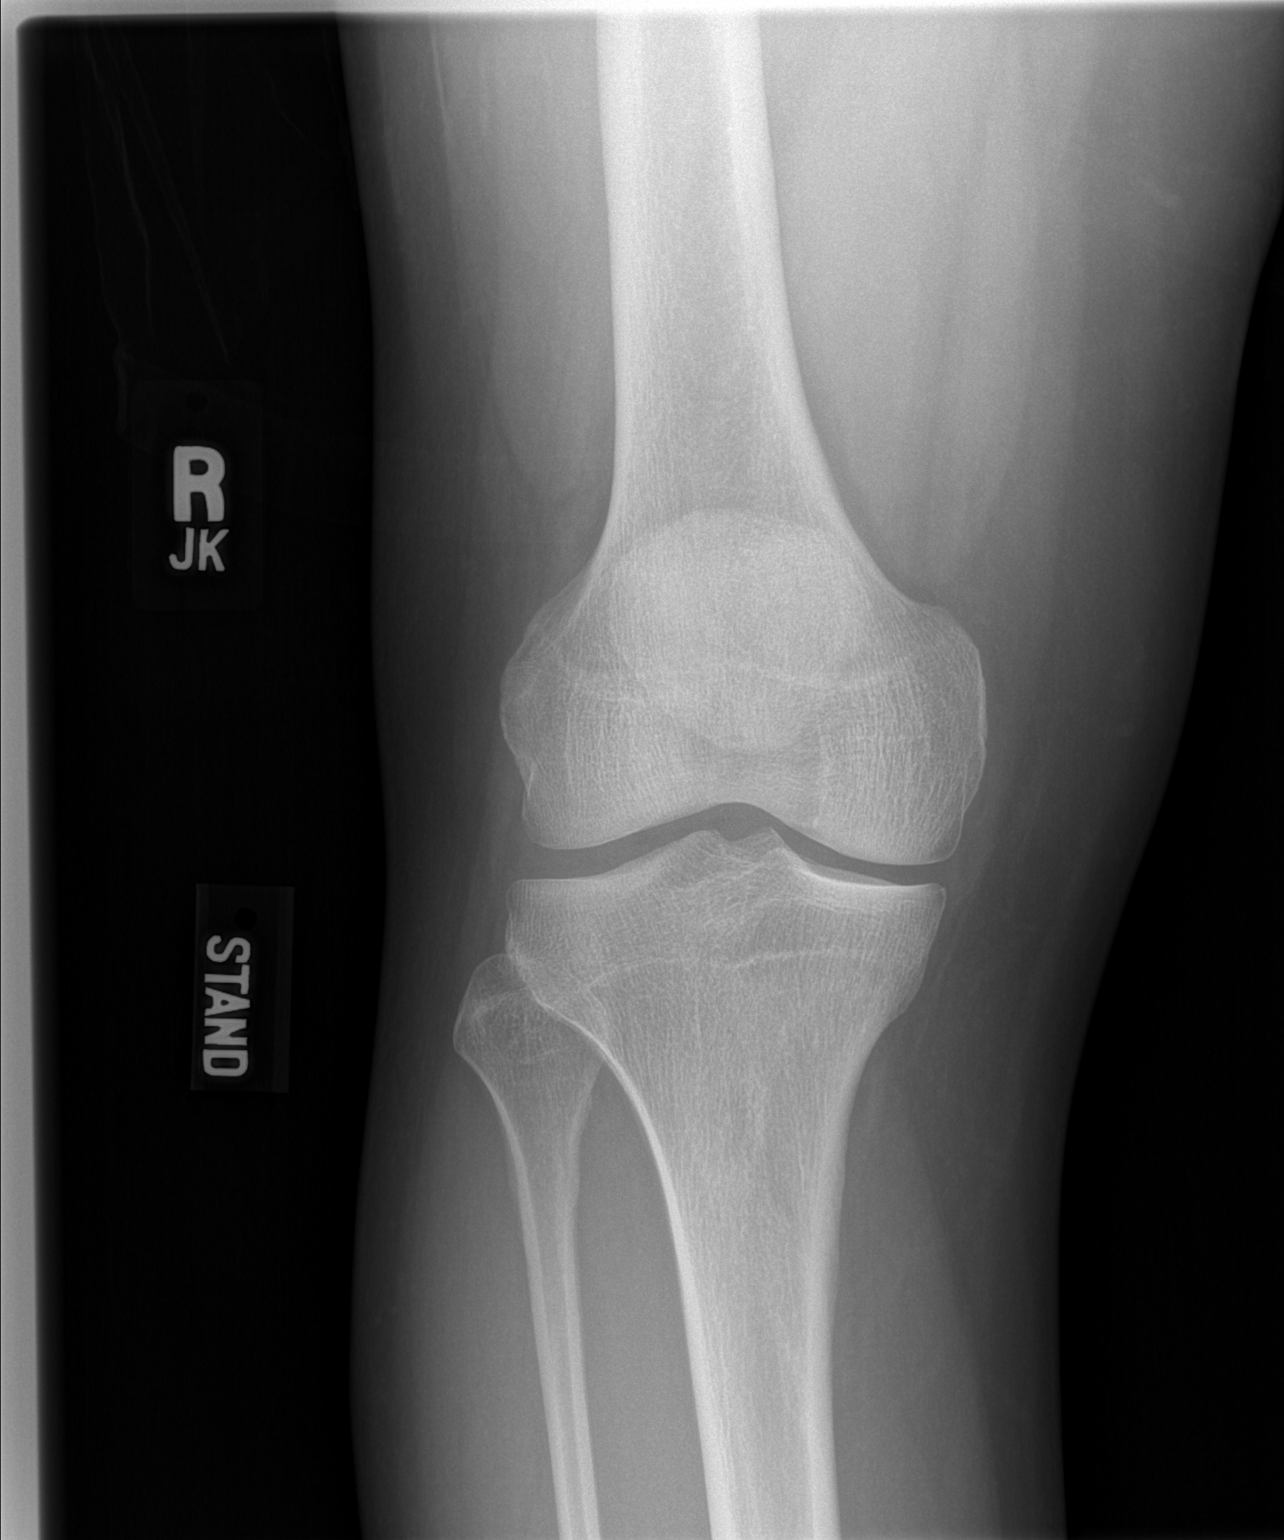

[w knee obl. left (1 of 2)]
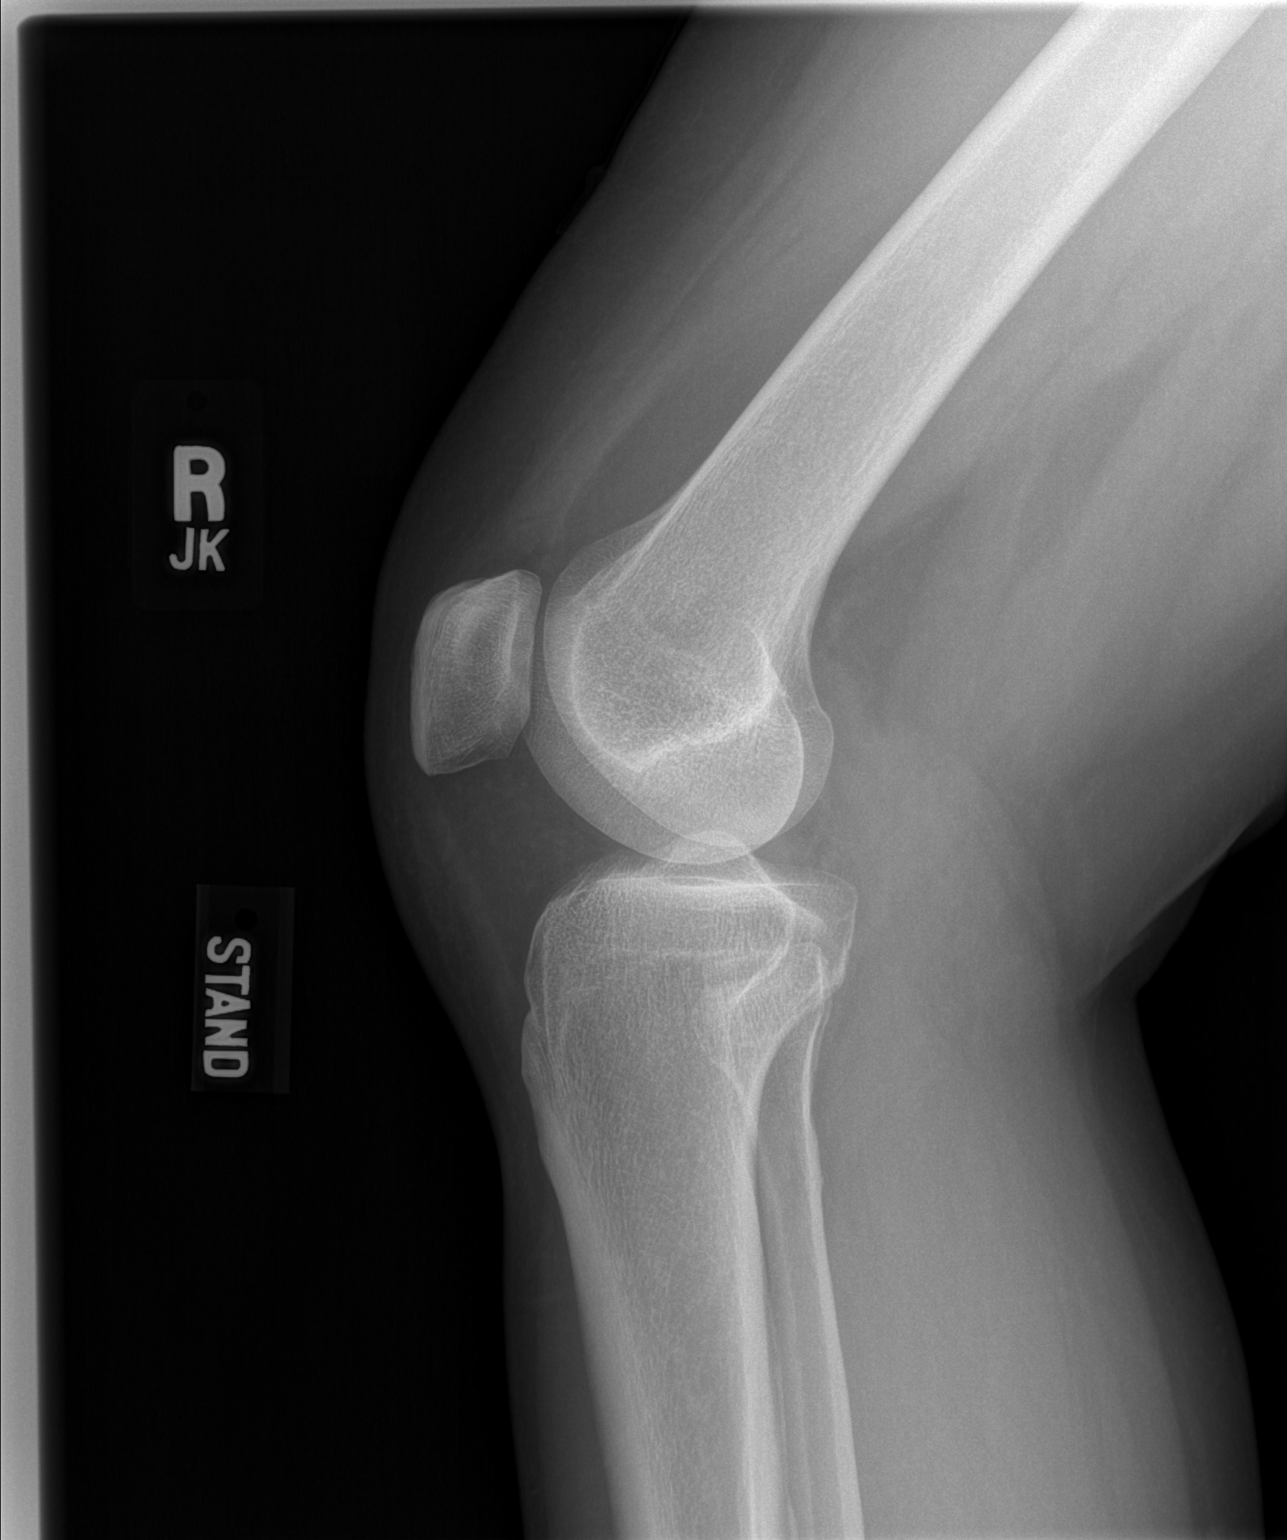

[w knee obl. left (2 of 2)]
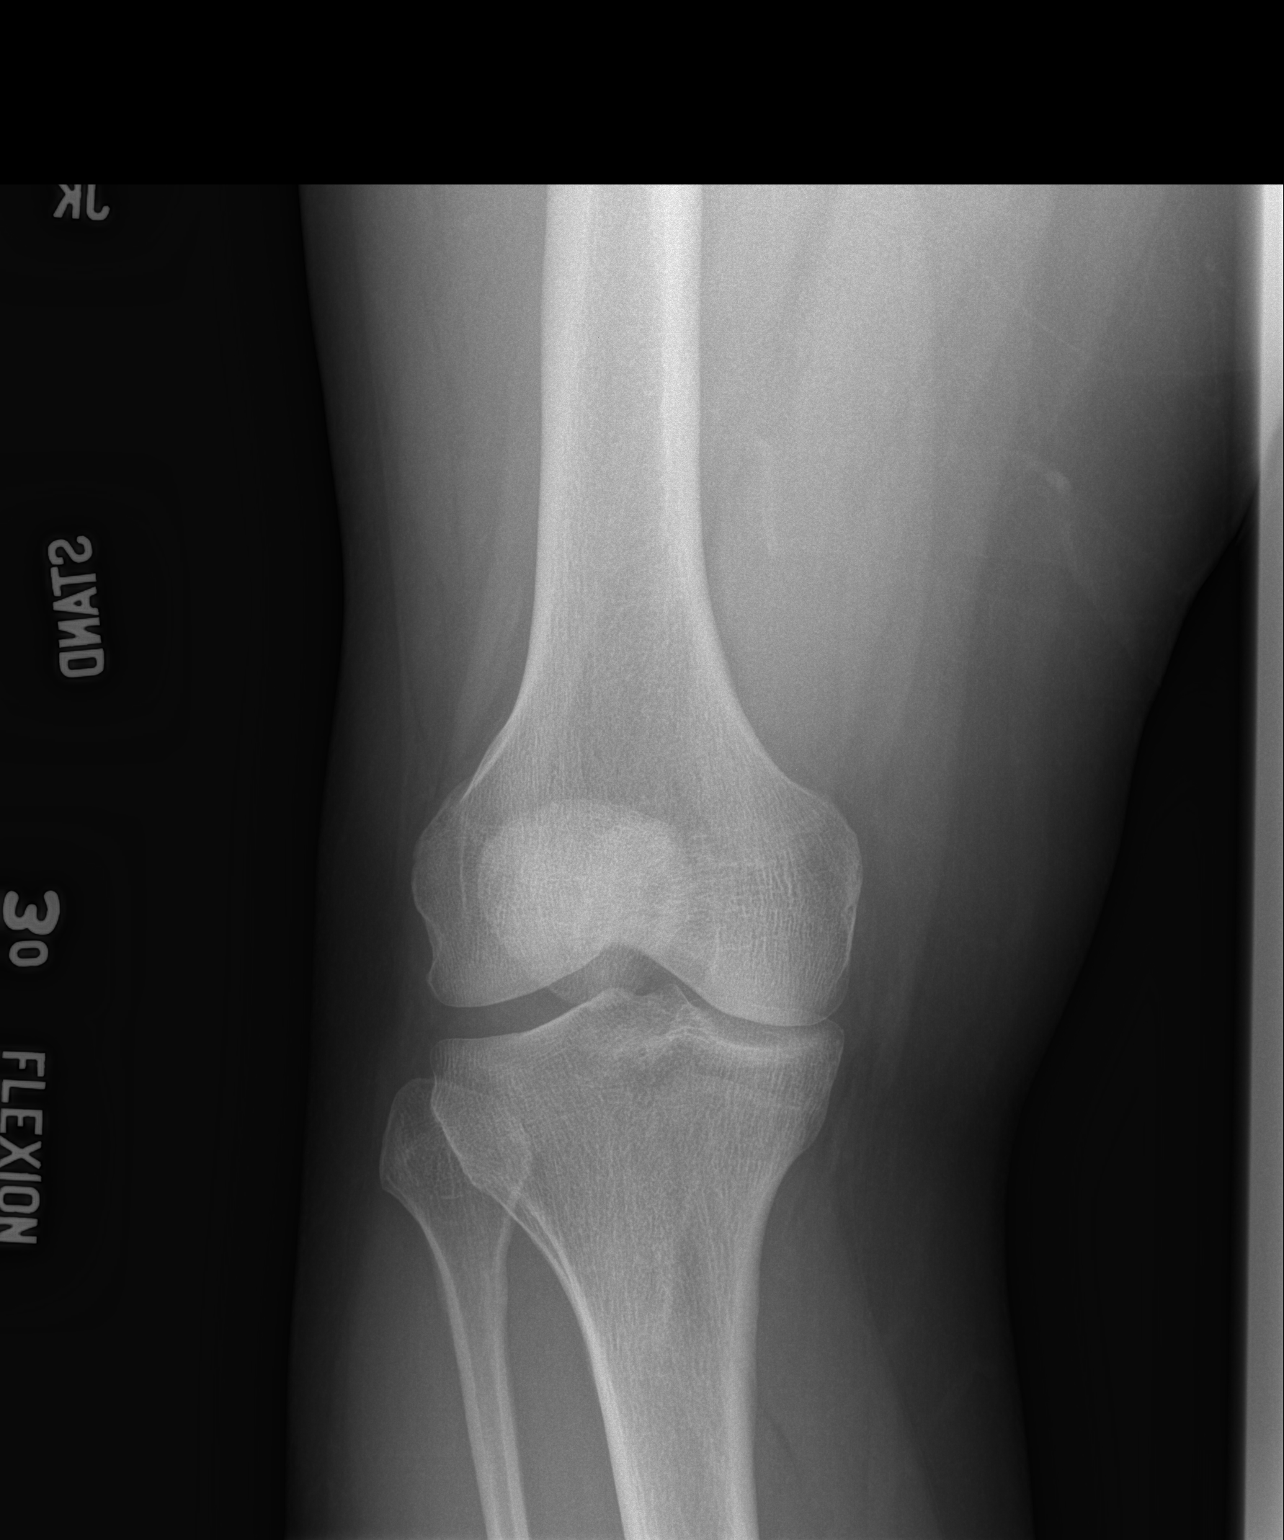

[3 of 3 positions shown; findings below may reference images not displayed]

FINDINGS: Standing frontal, standing tunnel, standing lateral, and sunrise
patellar images obtained. There is no fracture or dislocation. No
joint effusion. Joint spaces appear intact. No erosive change.
IMPRESSION: No fracture or joint effusion.  No appreciable arthropathy.

## 2016-01-14 ENCOUNTER — Ambulatory Visit (HOSPITAL_COMMUNITY)
Admission: EM | Admit: 2016-01-14 | Discharge: 2016-01-14 | Disposition: A | Discharge status: Home / Self Care | Payer: BLUE CROSS/BLUE SHIELD | Attending: Family Medicine | Admitting: Family Medicine

## 2016-01-14 ENCOUNTER — Encounter (HOSPITAL_COMMUNITY): Payer: Self-pay | Admitting: Emergency Medicine

## 2016-01-14 DIAGNOSIS — R05 Cough: Secondary | ICD-10-CM

## 2016-01-14 DIAGNOSIS — R059 Cough, unspecified: Secondary | ICD-10-CM

## 2016-01-14 DIAGNOSIS — J209 Acute bronchitis, unspecified: Secondary | ICD-10-CM

## 2016-01-14 MED ORDER — BENZONATATE 100 MG PO CAPS: 100.0000 mg | ORAL_CAPSULE | Freq: Three times a day (TID) | ORAL | 0 refills | Status: DC

## 2016-01-14 MED ORDER — DOXYCYCLINE HYCLATE 100 MG PO CAPS: 100.0000 mg | ORAL_CAPSULE | Freq: Two times a day (BID) | ORAL | 0 refills | Status: DC

## 2016-01-14 MED ORDER — METHYLPREDNISOLONE 4 MG PO TBPK: ORAL_TABLET | ORAL | 0 refills | Status: DC

## 2016-01-14 NOTE — ED Triage Notes (Signed)
The patient presented to the [facility] with a complaint of a cough and chest wall pain with new fever x 1 month.

## 2016-01-14 NOTE — ED Provider Notes (Signed)
CSN: [id number]     Arrival date & time 01/14/16  1202 History   First MD Initiated Contact with Patient 01/14/16 1240     Chief Complaint  Patient presents with  . Cough   (Consider location/radiation/quality/duration/timing/severity/associated sxs/prior Treatment) C/o uri sx's and cough for over a month.  He c/o severe cough at night.   The history is provided by the patient.  Cough  Cough characteristics:  Productive Sputum characteristics:  White Severity:  Severe Onset quality:  Gradual Duration:  4 weeks Timing:  Intermittent Chronicity:  Chronic Smoker: no   Relieved by:  Nothing   Past Medical History:  Diagnosis Date  . Alcohol abuse, daily use   . Cancer ([place])    Thyroid  . Thyroid disease    Past Surgical History:  Procedure Laterality Date  . THYROIDECTOMY     History reviewed. No pertinent family history. Social History  Substance Use Topics  . Smoking status: Never Smoker  . Smokeless tobacco: Never Used  . Alcohol use No     Comment: Quit    Review of Systems  Constitutional: Negative.   Eyes: Negative.   Respiratory: Positive for cough.   Cardiovascular: Negative.   Gastrointestinal: Negative.   Endocrine: Negative.   Genitourinary: Negative.   Musculoskeletal: Negative.   Allergic/Immunologic: Negative.   Neurological: Negative.     Allergies  Amoxicillin; Keflex [cephalexin]; and Penicillins  Home Medications   Prior to Admission medications   Medication Sig Start Date End Date Taking? Authorizing Provider  aspirin 81 MG tablet Take 81 mg by mouth daily.   Yes Historical Provider, MD  levothyroxine (SYNTHROID, LEVOTHROID) 175 MCG tablet Take 175 mcg by mouth daily.   Yes Historical Provider, MD  benzonatate (TESSALON) 100 MG capsule Take 1 capsule (100 mg total) by mouth every 8 (eight) hours. 01/14/16   Lysbeth Penner, FNP  doxycycline (VIBRAMYCIN) 100 MG capsule Take 1 capsule (100 mg total) by mouth 2 (two) times daily. 01/14/16    Lysbeth Penner, FNP  methylPREDNISolone (MEDROL DOSEPAK) 4 MG TBPK tablet Take 6-5-4-3-2-1 po qd 01/14/16   Lysbeth Penner, FNP   Meds Ordered and Administered this Visit  Medications - No data to display  BP 157/95 (BP Location: Left Arm)   Pulse 102   Temp 99.4 F (37.4 C) (Oral)   Resp 18   SpO2 98%  No data found.   Physical Exam  Constitutional: He appears well-developed and well-nourished.  HENT:  Head: Normocephalic and atraumatic.  Right Ear: External ear normal.  Left Ear: External ear normal.  Mouth/Throat: Oropharynx is clear and moist.  Eyes: Conjunctivae and EOM are normal. Pupils are equal, round, and reactive to light.  Neck: Normal range of motion. Neck supple.  Cardiovascular: Normal rate and regular rhythm.   Pulmonary/Chest: Effort normal and breath sounds normal.  Nursing note and vitals reviewed.   Urgent Care Course   Clinical Course     Procedures (including critical care time)  Labs Review Labs Reviewed - No data to display  Imaging Review No results found.   Visual Acuity Review  Right Eye Distance:   Left Eye Distance:   Bilateral Distance:    Right Eye Near:   Left Eye Near:    Bilateral Near:         MDM   1. Acute bronchitis, unspecified organism   2. Cough    Doxycycline 100mg  one po bid x 10 days #20 Medrol dose pack as directed #  21 4mg  Tessalon Perles 100mg  one po tid prn #21  Push po fluids, rest, tylenol and motrin otc prn as directed for fever, arthralgias, and myalgias.  Follow up prn if sx's continue or persist.    Lysbeth Penner, FNP 01/14/16 1301

## 2016-01-20 ENCOUNTER — Ambulatory Visit (HOSPITAL_COMMUNITY)
Admission: EM | Admit: 2016-01-20 | Discharge: 2016-01-20 | Disposition: A | Discharge status: Home / Self Care | Payer: BLUE CROSS/BLUE SHIELD | Attending: Internal Medicine | Admitting: Internal Medicine

## 2016-01-20 ENCOUNTER — Encounter (HOSPITAL_COMMUNITY): Payer: Self-pay | Admitting: Family Medicine

## 2016-01-20 DIAGNOSIS — R05 Cough: Secondary | ICD-10-CM

## 2016-01-20 DIAGNOSIS — J40 Bronchitis, not specified as acute or chronic: Secondary | ICD-10-CM | POA: Diagnosis not present

## 2016-01-20 DIAGNOSIS — R059 Cough, unspecified: Secondary | ICD-10-CM

## 2016-01-20 MED ORDER — GUAIFENESIN-CODEINE 100-10 MG/5ML PO SOLN: 5.0000 mL | Freq: Three times a day (TID) | ORAL | 0 refills | Status: DC | PRN

## 2016-01-20 MED ORDER — DICLOFENAC SODIUM 75 MG PO TBEC: 75.0000 mg | DELAYED_RELEASE_TABLET | Freq: Two times a day (BID) | ORAL | 0 refills | Status: DC

## 2016-01-20 NOTE — Discharge Instructions (Signed)
I would advise you to finish your antibiotics that were prescribed on your previous visit. I have started you on a cough medicine with codeine in it. Take 5 mls, up to three times a day. This medicine will cause drowsiness. Do not drink alcohol or drive while taking this medicine. I have also prescribed a medicine for pain, diclofenac, take 1 tablet twice a day, the prescription was sent to your pharmacy. For your reflux symptoms, continue your current OTC therapy but also add Zantac, 1 tablet at night before bed. Should your symptoms persist or fail to resolve follow up with your primary care provider or return to clinic.

## 2016-01-20 NOTE — ED Triage Notes (Signed)
Pt here for left rib pain. sts he has been violently coughing and unsure if rub is broken or bruised.

## 2016-01-20 NOTE — ED Provider Notes (Signed)
CSN: [id number]     Arrival date & time 01/20/16  1205 History   First MD Initiated Contact with Patient 01/20/16 1320     Chief Complaint  Patient presents with  . Rib Injury   (Consider location/radiation/quality/duration/timing/severity/associated sxs/prior Treatment) Patient presents to clinic with several day history of cough, was seen in clinic on 01/14/16 and treated for bronchitis. He is still taking his antibiotics from his previous visit. He complains his cough has not improved and he has been having pain along his ribs when he coughs, and when he lays on his side. He has no fever, no shortness of breath, no wheezing, or other systemic symptoms.   The history is provided by the patient.    Past Medical History:  Diagnosis Date  . Alcohol abuse, daily use   . Cancer ([place])    Thyroid  . Thyroid disease    Past Surgical History:  Procedure Laterality Date  . THYROIDECTOMY     History reviewed. No pertinent family history. Social History  Substance Use Topics  . Smoking status: Never Smoker  . Smokeless tobacco: Never Used  . Alcohol use No     Comment: Quit    Review of Systems  Constitutional: Negative.   HENT: Positive for congestion. Negative for sinus pain and sinus pressure.   Respiratory: Positive for cough. Negative for shortness of breath and wheezing.   Cardiovascular: Negative.   Gastrointestinal: Negative.   Musculoskeletal: Negative.   Neurological: Negative.   All other systems reviewed and are negative.   Allergies  Amoxicillin; Keflex [cephalexin]; and Penicillins  Home Medications   Prior to Admission medications   Medication Sig Start Date End Date Taking? Authorizing Provider  aspirin 81 MG tablet Take 81 mg by mouth daily.    Historical Provider, MD  benzonatate (TESSALON) 100 MG capsule Take 1 capsule (100 mg total) by mouth every 8 (eight) hours. 01/14/16   Lysbeth Penner, FNP  diclofenac (VOLTAREN) 75 MG EC tablet Take 1 tablet (75 mg  total) by mouth 2 (two) times daily. 01/20/16   Barnet Glasgow, NP  doxycycline (VIBRAMYCIN) 100 MG capsule Take 1 capsule (100 mg total) by mouth 2 (two) times daily. 01/14/16   Lysbeth Penner, FNP  guaiFENesin-codeine 100-10 MG/5ML syrup Take 5 mLs by mouth 3 (three) times daily as needed for cough. 01/20/16   Barnet Glasgow, NP  levothyroxine (SYNTHROID, LEVOTHROID) 175 MCG tablet Take 175 mcg by mouth daily.    Historical Provider, MD  methylPREDNISolone (MEDROL DOSEPAK) 4 MG TBPK tablet Take 6-5-4-3-2-1 po qd 01/14/16   Lysbeth Penner, FNP   Meds Ordered and Administered this Visit  Medications - No data to display  BP 141/98   Pulse 94   Temp 98 F (36.7 C)   Resp 18   SpO2 96%  No data found.   Physical Exam  Constitutional: He is oriented to person, place, and time. He appears well-developed and well-nourished. No distress.  HENT:  Head: Normocephalic.  Right Ear: External ear normal.  Left Ear: External ear normal.  Neck: Normal range of motion. No JVD present.  Cardiovascular: Normal rate and regular rhythm.   Pulmonary/Chest: Effort normal. He has no decreased breath sounds. He has no wheezes. He has rhonchi in the right lower field and the left lower field. He has no rales.  Abdominal: Soft. Bowel sounds are normal.  Lymphadenopathy:    He has no cervical adenopathy.  Neurological: He is alert and oriented to person,  place, and time.  Skin: Skin is warm and dry. Capillary refill takes less than 2 seconds. He is not diaphoretic.  Psychiatric: He has a normal mood and affect.  Nursing note and vitals reviewed.   Urgent Care Course   Clinical Course     Procedures (including critical care time)  Labs Review Labs Reviewed - No data to display  Imaging Review No results found.   Visual Acuity Review  Right Eye Distance:   Left Eye Distance:   Bilateral Distance:    Right Eye Near:   Left Eye Near:    Bilateral Near:         MDM   1. Cough    2. Bronchitis   I would advise you to finish your antibiotics that were prescribed on your previous visit. I have started you on a cough medicine with codeine in it. Take 5 mls, up to three times a day. This medicine will cause drowsiness. Do not drink alcohol or drive while taking this medicine. I have also prescribed a medicine for pain, diclofenac, take 1 tablet twice a day, the prescription was sent to your pharmacy. For your reflux symptoms, continue your current OTC therapy but also add Zantac, 1 tablet at night before bed. Should your symptoms persist or fail to resolve follow up with your primary care provider or return to clinic.      Barnet Glasgow, NP 01/20/16 1338

## 2016-02-27 ENCOUNTER — Ambulatory Visit (HOSPITAL_COMMUNITY)
Admission: EM | Admit: 2016-02-27 | Discharge: 2016-02-27 | Disposition: A | Discharge status: Home / Self Care | Payer: BLUE CROSS/BLUE SHIELD | Attending: Family Medicine | Admitting: Family Medicine

## 2016-02-27 ENCOUNTER — Encounter (HOSPITAL_COMMUNITY): Payer: Self-pay | Admitting: Emergency Medicine

## 2016-02-27 DIAGNOSIS — Z23 Encounter for immunization: Secondary | ICD-10-CM

## 2016-02-27 DIAGNOSIS — W19XXXA Unspecified fall, initial encounter: Secondary | ICD-10-CM

## 2016-02-27 DIAGNOSIS — S8002XA Contusion of left knee, initial encounter: Secondary | ICD-10-CM | POA: Diagnosis not present

## 2016-02-27 MED ORDER — TETANUS-DIPHTH-ACELL PERTUSSIS 5-2.5-18.5 LF-MCG/0.5 IM SUSP
0.5000 mL | Freq: Once | INTRAMUSCULAR | Status: AC
  Administered 2016-02-27: 0.5 mL via INTRAMUSCULAR

## 2016-02-27 MED ORDER — TETANUS-DIPHTH-ACELL PERTUSSIS 5-2.5-18.5 LF-MCG/0.5 IM SUSP
INTRAMUSCULAR | Status: AC
  Filled 2016-02-27: qty 0.5

## 2016-02-27 NOTE — ED Provider Notes (Signed)
CSN: [id number]     Arrival date & time 02/27/16  1329 History   First MD Initiated Contact with Patient 02/27/16 1547     Chief Complaint  Patient presents with  . Fall   (Consider location/radiation/quality/duration/timing/severity/associated sxs/prior Treatment) 56 year old male was sleeping on the couch last night and while he was very groggy and trying to get off of the couch she slid down directly onto his knee hitting the floor. His complaining of pain over the patella. He has a superficial abrasion and some swelling. He is able to ambulate and bear his full weight. Denies other injury. He is not sure when his last tetanus was.      Past Medical History:  Diagnosis Date  . Alcohol abuse, daily use   . Cancer ([place])    Thyroid  . Thyroid disease    Past Surgical History:  Procedure Laterality Date  . THYROIDECTOMY     History reviewed. No pertinent family history. Social History  Substance Use Topics  . Smoking status: Never Smoker  . Smokeless tobacco: Never Used  . Alcohol use No     Comment: Quit    Review of Systems  Constitutional: Negative.   Respiratory: Negative.   Gastrointestinal: Negative.   Genitourinary: Negative.   Musculoskeletal:       As per HPI Left knee anterior discomfort.  Skin: Positive for wound. Negative for color change.  Neurological: Negative for dizziness, weakness, numbness and headaches.  All other systems reviewed and are negative.   Allergies  Amoxicillin; Keflex [cephalexin]; and Penicillins  Home Medications   Prior to Admission medications   Medication Sig Start Date End Date Taking? Authorizing Provider  aspirin 81 MG tablet Take 81 mg by mouth daily.   Yes Historical Provider, MD  levothyroxine (SYNTHROID, LEVOTHROID) 175 MCG tablet Take 175 mcg by mouth daily.   Yes Historical Provider, MD   Meds Ordered and Administered this Visit   Medications  Tdap (BOOSTRIX) injection 0.5 mL (not administered)    BP 176/76  (BP Location: Right Arm)   Pulse 95   Temp 98.8 F (37.1 C) (Oral)   Resp 20   SpO2 96%  No data found.   Physical Exam  Constitutional: He is oriented to person, place, and time. He appears well-developed and well-nourished.  HENT:  Head: Normocephalic and atraumatic.  Eyes: EOM are normal. Left eye exhibits no discharge.  Neck: Normal range of motion. Neck supple.  Musculoskeletal: Normal range of motion. He exhibits edema. He exhibits no deformity.  Left knee with small abrasion anteriorly, superficial. Minor swelling at the medial and lateral joint spaces. He is able to perform full extension and flexion. He is able to bear full weight and ambulate with slight antalgic gait. External and internal rotation of the knee does not produce pain or is limited. Negative varus, negative valgus. Negative drawer test. No laxity is appreciated in any plane of movement. No direct bony tenderness over the patella, tibia. No soft tissue tenderness over the calf or quadricep muscles. No posterior tenderness.  Neurological: He is alert and oriented to person, place, and time. No cranial nerve deficit.  Skin: Skin is warm and dry.  Psychiatric: He has a normal mood and affect.  Nursing note and vitals reviewed.   Urgent Care Course     Procedures (including critical care time)  Labs Review Labs Reviewed - No data to display  Imaging Review No results found.   Visual Acuity Review  Right Eye Distance:  Left Eye Distance:   Bilateral Distance:    Right Eye Near:   Left Eye Near:    Bilateral Near:         MDM   1. Fall, initial encounter   2. Contusion of left knee, initial encounter    Wear the knee sleeve for about one week. Apply ice over the knee 3-4 times a day for 30 minutes at a time. He may take ibuprofen or Aleve as needed. Limit excessive ambulation or walking or prolonged standing for the next few days. Keep the abrasion clean with soap and water. Watch for any signs  of infection. Meds ordered this encounter  Medications  . Tdap (BOOSTRIX) injection 0.5 mL        Janne Napoleon, NP 02/27/16 1630

## 2016-02-27 NOTE — Discharge Instructions (Signed)
Wear the knee sleeve for about one week. Apply ice over the knee 3-4 times a day for 30 minutes at a time. He may take ibuprofen or Aleve as needed. Limit excessive ambulation or walking or prolonged standing for the next few days. Keep the abrasion clean with soap and water. Watch for any signs of infection.

## 2016-02-27 NOTE — ED Notes (Signed)
Patient's wound clean and dressed per provider's order.

## 2016-02-27 NOTE — ED Triage Notes (Signed)
The patient presented to the [facility] with a complaint of left knee pain secondary to a fall from standing that occurred last night.

## 2016-03-07 ENCOUNTER — Ambulatory Visit (INDEPENDENT_AMBULATORY_CARE_PROVIDER_SITE_OTHER): Payer: BLUE CROSS/BLUE SHIELD | Admitting: Physician Assistant

## 2016-03-15 ENCOUNTER — Ambulatory Visit (INDEPENDENT_AMBULATORY_CARE_PROVIDER_SITE_OTHER): Payer: BLUE CROSS/BLUE SHIELD | Admitting: Physician Assistant

## 2016-03-27 DIAGNOSIS — R5383 Other fatigue: Secondary | ICD-10-CM | POA: Diagnosis not present

## 2016-05-16 DIAGNOSIS — M25561 Pain in right knee: Secondary | ICD-10-CM | POA: Diagnosis not present

## 2016-05-28 ENCOUNTER — Ambulatory Visit (INDEPENDENT_AMBULATORY_CARE_PROVIDER_SITE_OTHER): Payer: BLUE CROSS/BLUE SHIELD | Admitting: Physician Assistant

## 2016-06-07 ENCOUNTER — Encounter (HOSPITAL_COMMUNITY): Payer: Self-pay | Admitting: Family Medicine

## 2016-06-07 ENCOUNTER — Ambulatory Visit (HOSPITAL_COMMUNITY)
Admission: EM | Admit: 2016-06-07 | Discharge: 2016-06-07 | Disposition: A | Discharge status: Home / Self Care | Payer: BLUE CROSS/BLUE SHIELD | Attending: Internal Medicine | Admitting: Internal Medicine

## 2016-06-07 DIAGNOSIS — M5489 Other dorsalgia: Secondary | ICD-10-CM | POA: Diagnosis not present

## 2016-06-07 DIAGNOSIS — M791 Myalgia: Secondary | ICD-10-CM | POA: Diagnosis not present

## 2016-06-07 DIAGNOSIS — M25512 Pain in left shoulder: Secondary | ICD-10-CM | POA: Diagnosis not present

## 2016-06-07 DIAGNOSIS — M7918 Myalgia, other site: Secondary | ICD-10-CM

## 2016-06-07 MED ORDER — NAPROXEN 500 MG PO TABS: 500.0000 mg | ORAL_TABLET | Freq: Two times a day (BID) | ORAL | 0 refills | Status: DC

## 2016-06-07 MED ORDER — METHYLPREDNISOLONE ACETATE 80 MG/ML IJ SUSP
INTRAMUSCULAR | Status: AC
  Filled 2016-06-07: qty 1

## 2016-06-07 MED ORDER — METHYLPREDNISOLONE SODIUM SUCC 125 MG IJ SOLR
INTRAMUSCULAR | Status: AC
  Filled 2016-06-07: qty 2

## 2016-06-07 MED ORDER — METHYLPREDNISOLONE SODIUM SUCC 125 MG IJ SOLR
80.0000 mg | Freq: Once | INTRAMUSCULAR | Status: AC
  Administered 2016-06-07: 125 mg via INTRAMUSCULAR

## 2016-06-07 NOTE — ED Triage Notes (Signed)
Pt here for pain between his shoulder blades and more on the left side since yesterday. sts that he was rolling over in bed and felt a sharp pain. Pt having pain with palpation to area. sts unable to get comfortable.

## 2016-06-07 NOTE — ED Provider Notes (Signed)
[place]    CSN: [id number] Arrival date & time: 06/07/16  1251     History   Chief Complaint Chief Complaint  Patient presents with  . Back Pain  . Shoulder Pain    HPI Richard Duarte is a 56 y.o. male. He rolled over in bed last night and felt a sharp discomfort in the lower left lateral shoulder blade area. This made it hard to get comfortable for the rest of the evening. It has been a little less intense today, but still there. Some positions are more painful than others. It hurts a lot to flex his neck forward, and it hurt last night to externally rotate his shoulders. He has been taking 800 mg of ibuprofen about every 4 hours. No prior history of this symptom. No trauma. No weakness or clumsiness of the arms. Was able to walk into the urgent care independently.    HPI  Past Medical History:  Diagnosis Date  . Alcohol abuse, daily use   . Cancer ([employer])    Thyroid  . Thyroid disease      Past Surgical History:  Procedure Laterality Date  . THYROIDECTOMY         Home Medications    Prior to Admission medications   Medication Sig Start Date End Date Taking? Authorizing Provider  aspirin 81 MG tablet Take 81 mg by mouth daily.    [provider]  levothyroxine (SYNTHROID, LEVOTHROID) 175 MCG tablet Take 175 mcg by mouth daily.    [provider]  naproxen (NAPROSYN) 500 MG tablet Take 1 tablet (500 mg total) by mouth 2 (two) times daily. 06/07/16   Sherlene Shams, MD    Family History History reviewed. No pertinent family history.  Social History Social History  Substance Use Topics  . Smoking status: Never Smoker  . Smokeless tobacco: Never Used  . Alcohol use No     Comment: Quit     Allergies   Amoxicillin; Keflex [cephalexin]; and Penicillins   Review of Systems Review of Systems  All other systems reviewed and are negative.    Physical Exam Triage Vital Signs ED Triage Vitals [06/07/16 1306]  Enc  Vitals Group     BP (!) 164/83     Pulse Rate 92     Resp 18     Temp 98.1 F (36.7 C)     Temp src      SpO2 97 %     Weight      Height      Pain Score 2     Pain Loc    Updated Vital Signs BP (!) 164/83   Pulse 92   Temp 98.1 F (36.7 C)   Resp 18   SpO2 97%   Physical Exam  Constitutional: He is oriented to person, place, and time. No distress.  Alert, nicely groomed  HENT:  Head: Atraumatic.  Eyes:  Conjugate gaze, no eye redness/drainage  Neck: Neck supple.  Cardiovascular: Normal rate.   Pulmonary/Chest: No respiratory distress.  Abdominal: He exhibits no distension.  Musculoskeletal: Normal range of motion.  Full and symmetric range of motion both shoulders including extension overhead, internal and external rotation. Some discomfort with neck rotation, particularly to the right, and with neck flexion forward. No rash no erythema no bruising of the posterior neck and upper back. No focal bony tenderness.  Neurological: He is alert and oriented to person, place, and time.  Skin: Skin is warm and dry.  No cyanosis  Nursing note and vitals reviewed.    UC Treatments / Results   Procedures Procedures (including critical care time)  Medications Ordered in UC Medications  methylPREDNISolone sodium succinate (SOLU-MEDROL) 125 mg/2 mL injection 80 mg (125 mg Intramuscular Given 06/07/16 1521)    Final Clinical Impressions(s) / UC Diagnoses   Final diagnoses:  Myofascial pain on left side   Pain in left shoulder blade area may be mild nerve root irritation or soft tissue inflammation. Anticipate gradual improvement over the next several days. Injection of Solu-Medrol (steroid, anti inflammatory) given at the urgent care. Prescription for naproxen (anti inflammatory, pain reliever) sent to the pharmacy. Ice for 5-10 minutes several times daily may be useful in reducing pain. Gentle stretching or physical therapy may provide additional benefit.  Meds ordered  this encounter  Medications  . naproxen (NAPROSYN) 500 MG tablet    Sig: Take 1 tablet (500 mg total) by mouth 2 (two) times daily.    Dispense:  30 tablet    Refill:  0      Sherlene Shams, MD 06/08/16 [phone]

## 2016-06-07 NOTE — Discharge Instructions (Addendum)
Pain in left shoulder blade area may be mild nerve root irritation or soft tissue inflammation. Anticipate gradual improvement over the next several days. Injection of Solu-Medrol (steroid, anti inflammatory) given at the urgent care. Prescription for naproxen (anti inflammatory, pain reliever) sent to the pharmacy. Ice for 5-10 minutes several times daily may be useful in reducing pain. Gentle stretching or physical therapy may provide additional benefit.

## 2016-06-27 DIAGNOSIS — Z85850 Personal history of malignant neoplasm of thyroid: Secondary | ICD-10-CM | POA: Diagnosis not present

## 2016-06-27 DIAGNOSIS — J0110 Acute frontal sinusitis, unspecified: Secondary | ICD-10-CM | POA: Diagnosis not present

## 2016-06-27 DIAGNOSIS — E039 Hypothyroidism, unspecified: Secondary | ICD-10-CM | POA: Diagnosis not present

## 2016-06-27 DIAGNOSIS — R030 Elevated blood-pressure reading, without diagnosis of hypertension: Secondary | ICD-10-CM | POA: Diagnosis not present

## 2016-07-04 DIAGNOSIS — F329 Major depressive disorder, single episode, unspecified: Secondary | ICD-10-CM | POA: Diagnosis not present

## 2016-07-04 DIAGNOSIS — E782 Mixed hyperlipidemia: Secondary | ICD-10-CM | POA: Diagnosis not present

## 2016-07-04 DIAGNOSIS — E039 Hypothyroidism, unspecified: Secondary | ICD-10-CM | POA: Diagnosis not present

## 2016-07-04 DIAGNOSIS — E663 Overweight: Secondary | ICD-10-CM | POA: Diagnosis not present

## 2016-08-08 DIAGNOSIS — E890 Postprocedural hypothyroidism: Secondary | ICD-10-CM | POA: Diagnosis not present

## 2016-08-08 DIAGNOSIS — Z23 Encounter for immunization: Secondary | ICD-10-CM | POA: Diagnosis not present

## 2016-08-08 DIAGNOSIS — Z0000 Encounter for general adult medical examination without abnormal findings: Secondary | ICD-10-CM | POA: Diagnosis not present

## 2016-08-08 DIAGNOSIS — Z125 Encounter for screening for malignant neoplasm of prostate: Secondary | ICD-10-CM | POA: Diagnosis not present

## 2016-08-08 DIAGNOSIS — Z85850 Personal history of malignant neoplasm of thyroid: Secondary | ICD-10-CM | POA: Diagnosis not present

## 2016-08-15 DIAGNOSIS — R404 Transient alteration of awareness: Secondary | ICD-10-CM | POA: Diagnosis not present

## 2016-08-15 DIAGNOSIS — R55 Syncope and collapse: Secondary | ICD-10-CM | POA: Diagnosis not present

## 2016-11-18 ENCOUNTER — Ambulatory Visit: Payer: BLUE CROSS/BLUE SHIELD | Admitting: Family Medicine

## 2016-11-18 ENCOUNTER — Encounter: Payer: Self-pay | Admitting: Family Medicine

## 2016-11-18 VITALS — BP 156/80 | HR 82 | Temp 97.8°F | Wt 271.8 lb

## 2016-11-18 DIAGNOSIS — E890 Postprocedural hypothyroidism: Secondary | ICD-10-CM | POA: Diagnosis not present

## 2016-11-18 DIAGNOSIS — I10 Essential (primary) hypertension: Secondary | ICD-10-CM

## 2016-11-18 DIAGNOSIS — E6609 Other obesity due to excess calories: Secondary | ICD-10-CM | POA: Diagnosis not present

## 2016-11-18 MED ORDER — LEVOTHYROXINE SODIUM 175 MCG PO TABS: 175.0000 ug | ORAL_TABLET | Freq: Every day | ORAL | 1 refills | Status: DC

## 2016-11-18 MED ORDER — LISINOPRIL 20 MG PO TABS: 10.0000 mg | ORAL_TABLET | Freq: Every day | ORAL | 3 refills | Status: DC

## 2016-11-18 NOTE — Progress Notes (Signed)
Subjective: No chief complaint on file.    HPI: Richard Duarte is a 56 y.o. 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 Today, he is concerned about his blood pressure for which he was started on Metoprolol but he is unsure why that specific drug was used. He has not to his knowledge used other drugs previously for HTN.   He is also interested in losing weight and stated diet and exercise has worked in the past.  He is currently living with his boyfriend and his boyfriend's parents in [place], [place]. He states the situation is stressful as his boyfriend has MS and needs constant medical care but his parents are unable to provide the care he needs.  Health Maintenance: [place] maintenance today. Will attempt to readdress at next visit     ROS noted in HPI.   Past Medical, Surgical, Social, and Family History Reviewed & Updated per EMR.   Pertinent Historical Findings include:   Social History   Tobacco Use  Smoking Status Never Smoker  Smokeless Tobacco Never Used      Objective: BP (!) 156/80 (BP Location: Right Wrist, Patient Position: Sitting, Cuff Size: Normal)   Pulse 82   Temp 97.8 F (36.6 C) (Oral)   Wt 271 lb 12.8 oz (123.3 kg)   SpO2 93%  Vitals and nursing notes reviewed  Physical Exam Gen: Alert and Oriented x 3, NAD HEENT: Normocephalic, atraumatic, PERRLA, EOMI, TM visible with good light reflex, non-swollen, non-erythematous turbinates, non-erythematous pharyngeal mucosa, no exudates Neck: trachea midline, no LAD, neck is obese CV: RRR, no murmurs, normal S1, S2 split, +2 pulses dorsalis pedis bilaterally Resp: CTAB, no wheezing, rales, or rhonchi, comfortable work of breathing Abd: non-distended, non-tender, soft, +bs in all four quadrants, no  hepatosplenomegaly MSK: FROM in all four extremities Ext: no clubbing, cyanosis, trace edema bilaterally in lower extremities Neuro: CN II-XII intact, no focal or gross deficits Skin: warm, dry, intact, no rashes Psych: appropriate behavior, mood   No results found for this or any previous visit (from the past 72 hour(s)).  Assessment/Plan:  Essential hypertension BP from patients home log shows elevated pressures for the past 12 days ranging from 712-458 systolic and 09-983 diastolic.  Starting patient on Lisinopril 10mg  daily and will recheck BP in two weeks. Ordered BMP today.  Stopped his Metoprolol 25mg .  Postoperative hypothyroidism Refilled his Levothyroxine 130mcg today per patient request. He states he has been stable on this dose for years and was being seen by endocrinology but was released as he has been stable for years.  Due to patient being new and unsure when his last TSH check was I ordered a TSH level today.  Obesity due to excess calories without serious comorbidity Patient expressed interest in losing weight and was told at a previous medical encounter that he could start a weight loss medication and would need to be on it for two years.  Patient did endorse successful weight loss in the past with diet and exercise. He got injured and was unable to exercise and he attributes this in addition to new life stressors as to the reason why he has gained so much weight back.  Encouraged patient to begin again with lifestyle modification on what he eats and encouraged patient to  lose weight slowly with carb modification but maintain a balanced diet with regular exercise. He like walking trails so he will begin doing that again.  Will follow and recommend to nutrition if he feel like he is not successful after 3 months.     PATIENT EDUCATION PROVIDED: See AVS    Diagnosis and plan along with any newly prescribed medication(s) were discussed in detail with this patient  today. The patient verbalized understanding and agreed with the plan. Patient advised if symptoms worsen return to clinic or ER.   Health Maintainance:   Orders Placed This Encounter  Procedures  . Basic Metabolic Panel  . TSH    Meds ordered this encounter  Medications  . levothyroxine (SYNTHROID, LEVOTHROID) 175 MCG tablet    Sig: Take 1 tablet (175 mcg total) daily by mouth.    Dispense:  90 tablet    Refill:  1  . lisinopril (PRINIVIL,ZESTRIL) 20 MG tablet    Sig: Take 0.5 tablets (10 mg total) daily by mouth.    Dispense:  90 tablet    Refill:  [place], DO 11/18/2016, 8:48 AM PGY-1, [place]

## 2016-11-18 NOTE — Assessment & Plan Note (Signed)
Patient expressed interest in losing weight and was told at a previous medical encounter that he could start a weight loss medication and would need to be on it for two years.  Patient did endorse successful weight loss in the past with diet and exercise. He got injured and was unable to exercise and he attributes this in addition to new life stressors as to the reason why he has gained so much weight back.  Encouraged patient to begin again with lifestyle modification on what he eats and encouraged patient to lose weight slowly with carb modification but maintain a balanced diet with regular exercise. He like walking trails so he will begin doing that again.  Will follow and recommend to nutrition if he feel like he is not successful after 3 months.

## 2016-11-18 NOTE — Assessment & Plan Note (Signed)
BP from patients home log shows elevated pressures for the past 12 days ranging from 356-701 systolic and 41-030 diastolic.  Starting patient on Lisinopril 10mg  daily and will recheck BP in two weeks. Ordered BMP today.  Stopped his Metoprolol 25mg .

## 2016-11-18 NOTE — Assessment & Plan Note (Signed)
Refilled his Levothyroxine 147mcg today per patient request. He states he has been stable on this dose for years and was being seen by endocrinology but was released as he has been stable for years.  Due to patient being new and unsure when his last TSH check was I ordered a TSH level today.

## 2016-11-18 NOTE — Patient Instructions (Addendum)
It was great to meet you today! Thank you for letting me participate in your care!  Today, we discussed your blood pressure. I will start you on a medication called Lisinopril to help get your blood pressure under control.  I have refilled your levothyroxine for your hypothyroidism. Please make sure to take it as prescribed. I will review your records and get any new labs that have not been obtained within the past year.  I will see you in 2 weeks to follow up with your blood pressure and any additional labs you may need.  Be well, Harolyn Rutherford, DO PGY-1, [facility]

## 2016-11-19 ENCOUNTER — Telehealth: Payer: Self-pay | Admitting: Family Medicine

## 2016-11-19 LAB — BASIC METABOLIC PANEL
BUN / CREAT RATIO: 17 (ref 9–20)
BUN: 17 mg/dL (ref 6–24)
CO2: 22 mmol/L (ref 20–29)
Calcium: 9.2 mg/dL (ref 8.7–10.2)
Chloride: 106 mmol/L (ref 96–106)
Creatinine, Ser: 1.01 mg/dL (ref 0.76–1.27)
GFR calc Af Amer: 94 mL/min/{1.73_m2} (ref >59)
GFR, EST NON AFRICAN AMERICAN: 82 mL/min/{1.73_m2} (ref >59)
GLUCOSE: 113 mg/dL — AB (ref 65–99)
Potassium: 4.2 mmol/L (ref 3.5–5.2)
SODIUM: 145 mmol/L — AB (ref 134–144)

## 2016-11-19 LAB — TSH: TSH: 2.53 u[IU]/mL (ref 0.450–4.500)

## 2016-11-19 NOTE — Telephone Encounter (Signed)
Pt called about getting his lab results on mychart and he said he wasn't given anything pertaining to the mychart. He tried to just go online to sign up for it and it says he needs an activation code which he doesn't have. Please advise

## 2016-11-21 ENCOUNTER — Telehealth: Payer: Self-pay | Admitting: Family Medicine

## 2016-11-21 NOTE — Telephone Encounter (Signed)
Pt called and said the bp medicine dr put him on hasnt been working to lower his bp. He was wondering if he should start taking a whole pill instead of a half or just a new medication all together. Pt said he has been checking his bp with a arm cuff like dr suggested and his bp yesterday morning was 169/101, last night 188/111 and this morning 174/110. Please advise

## 2016-11-22 ENCOUNTER — Telehealth: Payer: Self-pay | Admitting: *Deleted

## 2016-11-22 NOTE — Telephone Encounter (Signed)
Pt is having excessive coughing with Lisinopril. Would like it changed to something else. Legend Tumminello, Salome Spotted, CMA

## 2016-11-24 ENCOUNTER — Other Ambulatory Visit: Payer: Self-pay | Admitting: Family Medicine

## 2016-11-24 MED ORDER — LOSARTAN POTASSIUM 50 MG PO TABS: 50.0000 mg | ORAL_TABLET | Freq: Every day | ORAL | 3 refills | Status: DC

## 2016-11-24 NOTE — Progress Notes (Signed)
Changing his Lisinopril to Losartan due to patient reported cough. He was just started on Lisinopril less than one week ago. It is unlikely in this time frame he has developed a build up of bradykinin causing his cough due to Lisinopril. However, I will make a medication change to see if that improves his symptoms.

## 2016-11-25 NOTE — Telephone Encounter (Signed)
LMOVM for pt to call us back. Deseree Blount, CMA  

## 2016-12-02 ENCOUNTER — Other Ambulatory Visit: Payer: Self-pay

## 2016-12-02 ENCOUNTER — Ambulatory Visit: Payer: BLUE CROSS/BLUE SHIELD | Admitting: Internal Medicine

## 2016-12-02 ENCOUNTER — Encounter: Payer: Self-pay | Admitting: Internal Medicine

## 2016-12-02 DIAGNOSIS — I10 Essential (primary) hypertension: Secondary | ICD-10-CM | POA: Diagnosis not present

## 2016-12-02 MED ORDER — AMLODIPINE BESYLATE 10 MG PO TABS: 10.0000 mg | ORAL_TABLET | Freq: Every day | ORAL | 1 refills | Status: DC

## 2016-12-02 NOTE — Progress Notes (Signed)
   Subjective:   Patient: Richard Duarte       Birthdate: [birth year]       MRN: [id number]      HPI  Richard Duarte is a 56 y.o. male presenting for BP f/u. BMP showed no significant abnormalities.   HTN Patient last seen on 11/12 for this issue. Was previously taking lisinopril, and is currently taking losartan. Said that he had a chronic cough with lisinopril which has improved somewhat with losartan but is still present. He is interested in switching to a different BP med today as his pharmacist told him losartan could be the cause of his coughing. His family and friends have noticed that he is coughing frequently as well, and sometimes he even wakes up in the middle of the night with a cough. Denies fevers, chills, nasal congestion, or any other symptoms. He has been measuring his BP at home and tracking this on an app. BPs have ranged between 140-160/80-100 according to his app. Denies recurrent HA, changes in vision. He has not taken losartan this AM, as he takes Synthroid and thus must wait one hour after taking this to eat/take other meds.   Smoking status reviewed. Patient is never smoker.   Review of Systems See HPI.     Objective:  Physical Exam  Constitutional: He is oriented to person, place, and time and well-developed, well-nourished, and in no distress.  HENT:  Head: Normocephalic and atraumatic.  Eyes: Conjunctivae and EOM are normal. Right eye exhibits no discharge. Left eye exhibits no discharge.  Cardiovascular: Normal rate, regular rhythm and normal heart sounds.  No murmur heard. Pulmonary/Chest: Effort normal and breath sounds normal. No respiratory distress. He has no wheezes.  Neurological: He is alert and oriented to person, place, and time.  Skin: Skin is warm and dry.  Psychiatric: Affect and judgment normal.      Assessment & Plan:  Essential hypertension BP still above goal today at 158/98, however patient has not taken losartan today. Measured  BPs at home consistently above goal, ranging between 140-160/80-100. Continues to have persistent cough despite switching from lisinopril to losartan. Lungs CTAB, normal WOB on RA, and no reported signs or symptoms of URI, so suspect infectious etiology is less likely. No history of asthma or allergies, making this less likely as well. As such, will discontinue losartan and begin amlodipine.  - Stop losartan - Begin amlodipine 10mg  qd - F/u in one month - Cont to monitor BP at home   Adin Hector, MD, MPH PGY-3 [facility] Pager [phone]

## 2016-12-02 NOTE — Patient Instructions (Addendum)
It was nice meeting you today Mr. Karczewski!  Please START taking amlodipine (Norvasc) 10mg . You will take one tablet daily. Please STOP taking losartan.   We will see you back in a month to see how you are doing on the new medication. Please continue to monitor your blood pressure at home in the meantime.   If you have any questions or concerns, please feel free to call the clinic.   Be well,  Dr. Avon Gully

## 2016-12-02 NOTE — Assessment & Plan Note (Signed)
BP still above goal today at 158/98, however patient has not taken losartan today. Measured BPs at home consistently above goal, ranging between 140-160/80-100. Continues to have persistent cough despite switching from lisinopril to losartan. Lungs CTAB, normal WOB on RA, and no reported signs or symptoms of URI, so suspect infectious etiology is less likely. No history of asthma or allergies, making this less likely as well. As such, will discontinue losartan and begin amlodipine.  - Stop losartan - Begin amlodipine 10mg  qd - F/u in one month - Cont to monitor BP at home

## 2016-12-14 DIAGNOSIS — Z79899 Other long term (current) drug therapy: Secondary | ICD-10-CM | POA: Diagnosis not present

## 2016-12-14 DIAGNOSIS — R0781 Pleurodynia: Secondary | ICD-10-CM | POA: Diagnosis not present

## 2016-12-14 DIAGNOSIS — I10 Essential (primary) hypertension: Secondary | ICD-10-CM | POA: Diagnosis not present

## 2016-12-14 DIAGNOSIS — E079 Disorder of thyroid, unspecified: Secondary | ICD-10-CM | POA: Diagnosis not present

## 2016-12-14 DIAGNOSIS — Z880 Allergy status to penicillin: Secondary | ICD-10-CM | POA: Diagnosis not present

## 2016-12-14 DIAGNOSIS — S299XXA Unspecified injury of thorax, initial encounter: Secondary | ICD-10-CM | POA: Diagnosis not present

## 2016-12-14 DIAGNOSIS — R079 Chest pain, unspecified: Secondary | ICD-10-CM | POA: Diagnosis not present

## 2016-12-14 DIAGNOSIS — R0789 Other chest pain: Secondary | ICD-10-CM | POA: Diagnosis not present

## 2017-01-03 ENCOUNTER — Ambulatory Visit: Payer: BLUE CROSS/BLUE SHIELD | Admitting: Internal Medicine

## 2017-01-03 ENCOUNTER — Encounter: Payer: Self-pay | Admitting: Internal Medicine

## 2017-01-03 ENCOUNTER — Other Ambulatory Visit: Payer: Self-pay

## 2017-01-03 DIAGNOSIS — I10 Essential (primary) hypertension: Secondary | ICD-10-CM | POA: Diagnosis not present

## 2017-01-03 MED ORDER — AMLODIPINE BESYLATE 10 MG PO TABS: 10.0000 mg | ORAL_TABLET | Freq: Every day | ORAL | 3 refills | Status: DC

## 2017-01-03 NOTE — Progress Notes (Signed)
   Subjective:   Patient: Richard Duarte       Birthdate: [birth year]       MRN: [id number]      HPI  [name] is a 56 y.o. male presenting for HTN f/u.   HTN Patient last seen on 11/26 for this issue. Was taking losartan at that time, but was switched to amlodipine as he was having frequent coughing. BP elevated at that visit and consistently above goal from home measurements as well.  Today, patient reports significant improvement in cough since beginning amlodipine. Says occasionally he notices a cough but nothing consistent. Still measuring BP at home using an app. Average BP for past week has been elevated (avg systolic in 010U), however he says he thinks this is increased due to stress. Two weeks prior to that with average BPs of 115/75 and 131/90.  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 His boyfriend's parents are technically primary caretakers, however they have not taken him to medical appointments as they said they would, so now DSS is involved. This has been a significant source of stress in patient's life recently.    Smoking status reviewed. Patient is never smoker.   Review of Systems See HPI.     Objective:  Physical Exam  Constitutional: He is oriented to person, place, and time and well-developed, well-nourished, and in no distress.  HENT:  Head: Normocephalic and atraumatic.  Cardiovascular: Normal rate, regular rhythm and normal heart sounds.  No murmur heard. Pulmonary/Chest: Effort normal and breath sounds normal. No respiratory distress. He has no wheezes.  Neurological: He is alert and oriented to person, place, and time.  Skin: Skin is warm and dry.  Psychiatric: Affect and judgment normal.      Assessment & Plan:  Essential hypertension BP at goal today at 130/76. Elevated average BPs over past week per patient's app, however patient admits significant stress over the past week due to social situation. Avg BPs  in two prior weeks at goal. Cough has resolved. As such, will cont amlodipine 10mg  qd.  - F/u in 3 months   Adin Hector, MD, MPH PGY-3 [facility] Pager [phone]

## 2017-01-03 NOTE — Patient Instructions (Addendum)
It was nice seeing you again today Richard Duarte!  Please continue taking amlodipine 10 mg daily as you have been.   I will see you back in 3 months to see how your blood pressure is.   If you have any questions or concerns in the meantime, please feel free to call the clinic.   Be well,  Dr. Avon Gully

## 2017-01-03 NOTE — Assessment & Plan Note (Signed)
BP at goal today at 130/76. Elevated average BPs over past week per patient's app, however patient admits significant stress over the past week due to social situation. Avg BPs in two prior weeks at goal. Cough has resolved. As such, will cont amlodipine 10mg  qd.  - F/u in 3 months

## 2017-02-04 ENCOUNTER — Other Ambulatory Visit: Payer: Self-pay

## 2017-02-04 ENCOUNTER — Encounter: Payer: Self-pay | Admitting: Internal Medicine

## 2017-02-04 ENCOUNTER — Ambulatory Visit: Payer: BLUE CROSS/BLUE SHIELD | Admitting: Internal Medicine

## 2017-02-04 VITALS — BP 146/82 | HR 88 | Temp 97.8°F | Ht 67.0 in | Wt 277.0 lb

## 2017-02-04 DIAGNOSIS — E876 Hypokalemia: Secondary | ICD-10-CM | POA: Diagnosis not present

## 2017-02-04 DIAGNOSIS — R05 Cough: Secondary | ICD-10-CM

## 2017-02-04 DIAGNOSIS — I10 Essential (primary) hypertension: Secondary | ICD-10-CM | POA: Diagnosis not present

## 2017-02-04 DIAGNOSIS — R0683 Snoring: Secondary | ICD-10-CM | POA: Diagnosis not present

## 2017-02-04 DIAGNOSIS — R053 Chronic cough: Secondary | ICD-10-CM

## 2017-02-04 DIAGNOSIS — R350 Frequency of micturition: Secondary | ICD-10-CM

## 2017-02-04 DIAGNOSIS — R059 Cough, unspecified: Secondary | ICD-10-CM

## 2017-02-04 LAB — POCT URINALYSIS DIP (MANUAL ENTRY)
BILIRUBIN UA: NEGATIVE mg/dL
Bilirubin, UA: NEGATIVE
Glucose, UA: NEGATIVE mg/dL
LEUKOCYTES UA: NEGATIVE
NITRITE UA: NEGATIVE
PH UA: 5.5 (ref 5.0–8.0)
PROTEIN UA: NEGATIVE mg/dL
Spec Grav, UA: 1.02 (ref 1.010–1.025)
UROBILINOGEN UA: 0.2 U/dL

## 2017-02-04 LAB — POCT UA - MICROSCOPIC ONLY

## 2017-02-04 MED ORDER — TAMSULOSIN HCL 0.4 MG PO CAPS: 0.4000 mg | ORAL_CAPSULE | Freq: Every day | ORAL | 3 refills | Status: DC

## 2017-02-04 NOTE — Assessment & Plan Note (Signed)
Above goal today at 146/82, though patient has not taken amlodipine yet today. Consistently above goal at home as well, with systolics in low 277A. Will continue amlodipine for now as beginning Flomax which can decrease BP as well, however may need to start a second agent if BP still elevated at next visit despite addition of Flomax.

## 2017-02-04 NOTE — Assessment & Plan Note (Signed)
Reports history of this and concern that K has been low recently. Checking BMP for HTN, so will check K as well.  - Call if any abnormalities. Can consider adding K supplementation if low.

## 2017-02-04 NOTE — Assessment & Plan Note (Signed)
With some dribbling at the end of urination. Concern for BPH. No reported symptoms of UTI, and UA with no signs of infection, so less likely cause of increased frequency. Will begin trial of Flomax today.  - F/u in one month

## 2017-02-04 NOTE — Progress Notes (Signed)
Subjective:   Patient: Richard Duarte       Birthdate: 03-07-1960       MRN: [id number]      HPI  Richard Duarte is a 57 y.o. male presenting for HTN, cough, increased urination, concerns regarding sleep apnea.   Dry cough Ongoing issue. Initially was on lisinopril; this was discontinued. Symptoms improved for a while, though have now returned. Cough usually present at nighttime, particularly bedtime. Always non-productive. Describes as feeling like a tickle in his throat. No SOB. Lives with a smoker but has noticed that the cough is present even when he is not at home. Sometimes loses his voice as well, and friends have noticed that he clears his throat a lot. Patient is currently taking omeprazole and ranitidine for self-diagnosed GERD, though these have not improved his cough at all.   Increased urinary frequency New issue. Reports having to get up multiple times throughout the night to urinate. Also reports some dribbling at the end of urination. No difficulty starting stream. No dysuria, hematuria. Has not started drinking more fluids than usual. No fevers, abd pain, N/V.   Concerns regarding sleep apnea Patient's friends have noticed that he seems to gasp for air at times when they have seen him sleeping on the couch. As such, patient is concerned he may have sleep apnea and would like a sleep study. He says he is never well-rested when he wakes up in the morning. Snores loudly, though has always snored. Denies PND.   Hypokalemia Patient also thinks his K is low and would like this checked today. Says he has been admitted before for hypokalemia and he "knows how it feels." Michela Pitcher that he knows his K was low the other night because he could feel it. Does not take any K supplements.   HTN Taking amlodipine as prescribed. Slightly above goal at home, with systolics consistently in low 140s.   Smoking status reviewed. Patient is never smoker.   Review of Systems See HPI.       Objective:  Physical Exam  Constitutional: He is oriented to person, place, and time and well-developed, well-nourished, and in no distress.  HENT:  Head: Normocephalic and atraumatic.  Nose: Nose normal.  Mouth/Throat: Oropharynx is clear and moist. No oropharyngeal exudate.  Eyes: Conjunctivae and EOM are normal. Pupils are equal, round, and reactive to light. Right eye exhibits no discharge. Left eye exhibits no discharge.  Cardiovascular: Normal rate.  Pulmonary/Chest: Effort normal and breath sounds normal. No respiratory distress. He has no wheezes.  Neurological: He is alert and oriented to person, place, and time.  Skin: Skin is warm and dry.  Psychiatric: Affect and judgment normal.      Assessment & Plan:  Essential hypertension Above goal today at 146/82, though patient has not taken amlodipine yet today. Consistently above goal at home as well, with systolics in low 174B. Will continue amlodipine for now as beginning Flomax which can decrease BP as well, however may need to start a second agent if BP still elevated at next visit despite addition of Flomax.   Snoring Concern for sleep apnea, as friends have noticed patient gasping while he sleeps. Also never well-rested upon awakening in the AM and patient with very loud snoring per his report. Will refer for sleep study.  - Referral to sleep center placed  Increased urinary frequency With some dribbling at the end of urination. Concern for BPH. No reported symptoms of UTI, and UA with no  signs of infection, so less likely cause of increased frequency. Will begin trial of Flomax today.  - F/u in one month  Persistent dry cough Ongoing for many months now. No improvement with discontinuation of lisinopril or losartan. Not currently on any medications with cough as typical side effect. Non-productive, lungs CTAB, and afebrile, so PNA or viral process less likely. Could be GERD-related,though patient is already taking omeprazole  and ranitidine. As no obvious cause on physical exam today and problem persisting, will refer to ENT for further work-up.  - Referral to ENT placed  Hypokalemia Reports history of this and concern that K has been low recently. Checking BMP for HTN, so will check K as well.  - Call if any abnormalities. Can consider adding K supplementation if low.    Adin Hector, MD, MPH PGY-3 [facility] Pager [phone]

## 2017-02-04 NOTE — Assessment & Plan Note (Signed)
Ongoing for many months now. No improvement with discontinuation of lisinopril or losartan. Not currently on any medications with cough as typical side effect. Non-productive, lungs CTAB, and afebrile, so PNA or viral process less likely. Could be GERD-related,though patient is already taking omeprazole and ranitidine. As no obvious cause on physical exam today and problem persisting, will refer to ENT for further work-up.  - Referral to ENT placed

## 2017-02-04 NOTE — Assessment & Plan Note (Signed)
Concern for sleep apnea, as friends have noticed patient gasping while he sleeps. Also never well-rested upon awakening in the AM and patient with very loud snoring per his report. Will refer for sleep study.  - Referral to sleep center placed

## 2017-02-04 NOTE — Patient Instructions (Addendum)
It was nice seeing you again today Mr. Knerr!  Please begin taking Flomax (tamsulosin) one tablet daily. If there are any abnormalities with your urine sample suggesting an infection, I will call to let you know.   I have placed a referral to both ENT to assess your cough and the Sleep Center for a sleep study to determine if you have sleep apnea or not. They will call you to set up the date and time of these appointments.   I will call you if your potassium is low or if there are any other abnormalities with the bloodwork we are collecting today.   I will see you back in one month to check your blood pressure and see how you are doing with the new medications.   If you have any questions or concerns, please feel free to call the clinic.   Be well,  Dr. Avon Gully

## 2017-02-05 LAB — BASIC METABOLIC PANEL
BUN / CREAT RATIO: 16 (ref 9–20)
BUN: 17 mg/dL (ref 6–24)
CALCIUM: 8.9 mg/dL (ref 8.7–10.2)
CO2: 21 mmol/L (ref 20–29)
Chloride: 102 mmol/L (ref 96–106)
Creatinine, Ser: 1.05 mg/dL (ref 0.76–1.27)
GFR calc non Af Amer: 78 mL/min/{1.73_m2} (ref >59)
GFR, EST AFRICAN AMERICAN: 90 mL/min/{1.73_m2} (ref >59)
Glucose: 154 mg/dL — ABNORMAL HIGH (ref 65–99)
POTASSIUM: 4 mmol/L (ref 3.5–5.2)
SODIUM: 140 mmol/L (ref 134–144)

## 2017-02-06 ENCOUNTER — Telehealth: Payer: Self-pay

## 2017-02-06 NOTE — Telephone Encounter (Signed)
Richard Duarte from Dr Maren Reamer office left message on nurse line that in-lab sleep study was denied by [facility] but that they will approve an at-home sleep study. Please enter new order in Epic for in-home sleep study. Danley Danker, RN [facility] RN)

## 2017-02-07 ENCOUNTER — Other Ambulatory Visit: Payer: Self-pay | Admitting: Family Medicine

## 2017-02-07 DIAGNOSIS — R0683 Snoring: Secondary | ICD-10-CM

## 2017-02-07 NOTE — Progress Notes (Signed)
Received a message that sleep study was denied by insurance but would approve in home sleep study.  Entered order into Epic.

## 2017-02-12 ENCOUNTER — Telehealth: Payer: Self-pay | Admitting: Family Medicine

## 2017-02-12 ENCOUNTER — Encounter: Payer: Self-pay | Admitting: Internal Medicine

## 2017-02-12 DIAGNOSIS — J383 Other diseases of vocal cords: Secondary | ICD-10-CM | POA: Diagnosis not present

## 2017-02-12 DIAGNOSIS — J392 Other diseases of pharynx: Secondary | ICD-10-CM | POA: Diagnosis not present

## 2017-02-12 DIAGNOSIS — R05 Cough: Secondary | ICD-10-CM | POA: Diagnosis not present

## 2017-02-12 DIAGNOSIS — K219 Gastro-esophageal reflux disease without esophagitis: Secondary | ICD-10-CM | POA: Diagnosis not present

## 2017-02-12 NOTE — Telephone Encounter (Signed)
This patient went to the ENT this morning where [facility] sent him and when they did the procedure they found a white puffy spot on his right nodule on his vocal cords. The ENT said it's most likely due to acid reflux and the ENT also said with this patients history with acid reflux, he's not sure why this patient wasn't referred to a Gastroenterologist  instead of an ENT. The ENT said he would be happy to proceed with a biopsy and do some more scans, but the patient would rather get a referral to a gastroenterologist first and see what they say before going forward with the ENT.   Patient also said he was supposed to be getting a sleep study done, but they can only do the sleep study at his home. 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 He doesn't know how far out they travel for that.  Please contact patient when the referral to the Gastroenterologist has been placed and about the sleep study.

## 2017-02-13 ENCOUNTER — Encounter: Payer: Self-pay | Admitting: Internal Medicine

## 2017-02-13 ENCOUNTER — Other Ambulatory Visit: Payer: Self-pay | Admitting: Internal Medicine

## 2017-02-13 DIAGNOSIS — R0683 Snoring: Secondary | ICD-10-CM

## 2017-02-13 DIAGNOSIS — K219 Gastro-esophageal reflux disease without esophagitis: Secondary | ICD-10-CM

## 2017-02-13 DIAGNOSIS — R053 Chronic cough: Secondary | ICD-10-CM

## 2017-02-13 DIAGNOSIS — R05 Cough: Secondary | ICD-10-CM

## 2017-02-13 NOTE — Progress Notes (Signed)
Per patient request and ENT recommendation, referral to GI placed. New referral for sleep study also placed as patient wishing for sleep study to be performed in [place] closer to his home in [place], [place]. Responded to patient's advise request regarding these matters.   Adin Hector, MD, MPH PGY-3 [facility] Pager [phone]

## 2017-02-14 ENCOUNTER — Encounter: Payer: Self-pay | Admitting: Family Medicine

## 2017-02-14 ENCOUNTER — Encounter: Payer: Self-pay | Admitting: Internal Medicine

## 2017-02-16 ENCOUNTER — Other Ambulatory Visit: Payer: Self-pay | Admitting: Otolaryngology

## 2017-02-16 DIAGNOSIS — K219 Gastro-esophageal reflux disease without esophagitis: Secondary | ICD-10-CM

## 2017-02-17 ENCOUNTER — Other Ambulatory Visit: Payer: Self-pay | Admitting: *Deleted

## 2017-02-17 ENCOUNTER — Encounter: Payer: Self-pay | Admitting: Gastroenterology

## 2017-02-17 ENCOUNTER — Encounter: Payer: Self-pay | Admitting: Internal Medicine

## 2017-02-18 ENCOUNTER — Other Ambulatory Visit: Payer: Self-pay | Admitting: Internal Medicine

## 2017-02-18 ENCOUNTER — Encounter: Payer: Self-pay | Admitting: Internal Medicine

## 2017-02-18 MED ORDER — LEVOTHYROXINE SODIUM 175 MCG PO TABS: 175.0000 ug | ORAL_TABLET | Freq: Every day | ORAL | 0 refills | Status: DC

## 2017-02-18 MED ORDER — BENZONATATE 200 MG PO CAPS: 200.0000 mg | ORAL_CAPSULE | Freq: Three times a day (TID) | ORAL | 0 refills | Status: DC | PRN

## 2017-02-18 NOTE — Telephone Encounter (Signed)
Called patient back and also sent him message through epic letting him know I will place referral to GI. Also, I could not find a sleep study place in [place] but did find several in [place]. I informed patient on VM that if he does not have a preference for GI clinic I will just make him a referral.

## 2017-02-18 NOTE — Progress Notes (Signed)
Prescription for Tessalon perles sent in for patient's cough. Also encouraged him to schedule appt with GI ASAP whenever he is contacted by them. Instructed to call office or go to ED if cough worsens or develops resp distress.   Adin Hector, MD, MPH PGY-3 [facility] Pager [phone]

## 2017-02-19 ENCOUNTER — Telehealth: Payer: Self-pay

## 2017-02-19 ENCOUNTER — Encounter: Payer: Self-pay | Admitting: Family Medicine

## 2017-02-19 NOTE — Telephone Encounter (Signed)
Patient instructed to take Prednisone 50mg  PO 03/03/17 @ 2100, 03/04/17 @ 0100 and 0700 and to take Benadryl 50mg  PO 03/04/17 @ 0700.  Brita Romp, RN

## 2017-02-24 ENCOUNTER — Encounter (HOSPITAL_BASED_OUTPATIENT_CLINIC_OR_DEPARTMENT_OTHER): Payer: BLUE CROSS/BLUE SHIELD

## 2017-03-03 ENCOUNTER — Encounter: Payer: Self-pay | Admitting: Family Medicine

## 2017-03-03 ENCOUNTER — Telehealth: Payer: Self-pay

## 2017-03-03 ENCOUNTER — Encounter: Payer: Self-pay | Admitting: Internal Medicine

## 2017-03-03 DIAGNOSIS — S29012A Strain of muscle and tendon of back wall of thorax, initial encounter: Secondary | ICD-10-CM | POA: Diagnosis not present

## 2017-03-03 NOTE — Telephone Encounter (Signed)
Patient left message on nurse line asking for refill on previously prescribed Cyclobenzaprine. Has sent MyChart message also. Danley Danker, RN [facility] RN)

## 2017-03-04 ENCOUNTER — Encounter: Payer: Self-pay | Admitting: Internal Medicine

## 2017-03-04 ENCOUNTER — Encounter: Payer: Self-pay | Admitting: Family Medicine

## 2017-03-04 ENCOUNTER — Inpatient Hospital Stay
Admission: RE | Admit: 2017-03-04 | Discharge: 2017-03-04 | Disposition: A | Discharge status: Home / Self Care | Payer: BLUE CROSS/BLUE SHIELD | Source: Ambulatory Visit | Attending: Otolaryngology | Admitting: Otolaryngology

## 2017-03-05 ENCOUNTER — Ambulatory Visit: Payer: BLUE CROSS/BLUE SHIELD | Admitting: Internal Medicine

## 2017-03-06 ENCOUNTER — Encounter: Payer: Self-pay | Admitting: Family Medicine

## 2017-03-19 ENCOUNTER — Other Ambulatory Visit: Payer: Self-pay | Admitting: Family Medicine

## 2017-03-19 DIAGNOSIS — R0683 Snoring: Secondary | ICD-10-CM

## 2017-04-03 ENCOUNTER — Other Ambulatory Visit (INDEPENDENT_AMBULATORY_CARE_PROVIDER_SITE_OTHER): Payer: BLUE CROSS/BLUE SHIELD

## 2017-04-03 ENCOUNTER — Ambulatory Visit (INDEPENDENT_AMBULATORY_CARE_PROVIDER_SITE_OTHER): Payer: BLUE CROSS/BLUE SHIELD | Admitting: Gastroenterology

## 2017-04-03 ENCOUNTER — Encounter: Payer: Self-pay | Admitting: Gastroenterology

## 2017-04-03 VITALS — BP 144/80 | HR 96 | Ht 67.0 in | Wt 269.6 lb

## 2017-04-03 DIAGNOSIS — Z7289 Other problems related to lifestyle: Secondary | ICD-10-CM

## 2017-04-03 DIAGNOSIS — K219 Gastro-esophageal reflux disease without esophagitis: Secondary | ICD-10-CM | POA: Diagnosis not present

## 2017-04-03 DIAGNOSIS — Z789 Other specified health status: Secondary | ICD-10-CM

## 2017-04-03 DIAGNOSIS — R1310 Dysphagia, unspecified: Secondary | ICD-10-CM

## 2017-04-03 DIAGNOSIS — Z1211 Encounter for screening for malignant neoplasm of colon: Secondary | ICD-10-CM | POA: Diagnosis not present

## 2017-04-03 DIAGNOSIS — R05 Cough: Secondary | ICD-10-CM

## 2017-04-03 DIAGNOSIS — R059 Cough, unspecified: Secondary | ICD-10-CM

## 2017-04-03 LAB — CBC WITH DIFFERENTIAL/PLATELET
BASOS PCT: 0.9 % (ref 0.0–3.0)
Basophils Absolute: 0.1 10*3/uL (ref 0.0–0.1)
EOS PCT: 1.4 % (ref 0.0–5.0)
Eosinophils Absolute: 0.1 10*3/uL (ref 0.0–0.7)
HEMATOCRIT: 42.7 % (ref 39.0–52.0)
HEMOGLOBIN: 14.6 g/dL (ref 13.0–17.0)
LYMPHS PCT: 20.2 % (ref 12.0–46.0)
Lymphs Abs: 2 10*3/uL (ref 0.7–4.0)
MCHC: 34.3 g/dL (ref 30.0–36.0)
MCV: 86.2 fl (ref 78.0–100.0)
Monocytes Absolute: 0.9 10*3/uL (ref 0.1–1.0)
Monocytes Relative: 8.9 % (ref 3.0–12.0)
Neutro Abs: 6.7 10*3/uL (ref 1.4–7.7)
Neutrophils Relative %: 68.6 % (ref 43.0–77.0)
Platelets: 242 10*3/uL (ref 150.0–400.0)
RBC: 4.95 Mil/uL (ref 4.22–5.81)
RDW: 13 % (ref 11.5–15.5)
WBC: 9.7 10*3/uL (ref 4.0–10.5)

## 2017-04-03 LAB — HEPATIC FUNCTION PANEL
ALBUMIN: 4.1 g/dL (ref 3.5–5.2)
ALT: 24 U/L (ref 0–53)
AST: 17 U/L (ref 0–37)
Alkaline Phosphatase: 84 U/L (ref 39–117)
Bilirubin, Direct: 0.1 mg/dL (ref 0.0–0.3)
Total Bilirubin: 0.3 mg/dL (ref 0.2–1.2)
Total Protein: 6.9 g/dL (ref 6.0–8.3)

## 2017-04-03 MED ORDER — SUPREP BOWEL PREP KIT 17.5-3.13-1.6 GM/177ML PO SOLN: ORAL | 0 refills | Status: DC

## 2017-04-03 MED ORDER — OMEPRAZOLE 20 MG PO CPDR: 20.0000 mg | DELAYED_RELEASE_CAPSULE | Freq: Two times a day (BID) | ORAL | 3 refills | Status: DC

## 2017-04-03 NOTE — Patient Instructions (Addendum)
If you are age 59 or older, your body mass index should be between 23-30. Your Body mass index is 42.23 kg/m. If this is out of the aforementioned range listed, please consider follow up with your Primary Care Provider.  If you are age 96 or younger, your body mass index should be between 19-25. Your Body mass index is 42.23 kg/m. If this is out of the aformentioned range listed, please consider follow up with your Primary Care Provider.   You have been scheduled for an endoscopy and colonoscopy. Please follow the written instructions given to you at your visit today. Please pick up your prep supplies at the pharmacy within the next 1-3 days. If you use inhalers (even only as needed), please bring them with you on the day of your procedure. Your physician has requested that you go to www.startemmi.com and enter the access code given to you at your visit today. This web site gives a general overview about your procedure. However, you should still follow specific instructions given to you by our office regarding your preparation for the procedure.  Increase your Omeprazole to twice a day. We have sent a new prescription to your pharmacy.  Please go to the lab in the basement of our building to have lab work done as you leave today.  Thank you for entrusting me with your care and for choosing [facility], Dr. Denver Cellar

## 2017-04-03 NOTE — Progress Notes (Signed)
HPI :  57 y/o male with a history of thyroid cancer, HTN, GERD, referred by Dr. Nuala Alpha, for history of reflux and chronic cough.  He states he has had a reflux issue for several years.  By reflux he refers to namely heartburn, he denies much regurgitation.  He also states he has occasional dysphagia to solids.  He has been on omeprazole 20 mg once a day with Zantac 150 mg nightly for a while.  He states this mostly controls his heartburn.  For the past year he has had some nocturnal symptoms where he will wake up with discomfort in his chest that radiates to his shoulders.  He states he sips of ginger ale when this occurs and this reliably relieves his episodes.  He states this occurs once or twice a month.  He denies any nausea or vomiting.  He denies any abdominal pains.  He denies any bowel habit changes.  He denies any blood in his stools.  He has never had a prior colonoscopy.  He has never had a prior upper endoscopy. he reports a history of a prior stress test of his heart which was normal.  He states he has a history of chronic cough.  He has had history of bronchitis and was living in an area with poor air quality, he also has some secondhand smoke exposure he is not sure if it is related to his cough.  His cough has been bothering him intermittently and has returned more recently, he is using Mucinex as needed which helps.  He has been seen by Dr. Janace Hoard of ear nose and throat. On 02/12/2017 - laryngoscopy - focal cord lesion c/w granuloma, recommended microlaryngoscopy with biopsy and CT scan.  He states he has not followed up for biopsy or CT scan of his neck yet, he is anxious about this.  He has had a history of thyroid cancer in 1988.  He denies any family history of colon cancer of esophageal cancer, but his sister had thyroid cancer as well.  He denies any tobacco use.  He does endorse 2-3 shots of scotch per night.  He states he is taking milk thistle and a "liver pill"to help  prevent damage to his liver.  He is not aware of any prior history of liver disease.   Past Medical History:  Diagnosis Date  . Alcohol abuse, daily use   . Anxiety   . Cancer ([place])    Thyroid  . Depression   . Thyroid disease      Past Surgical History:  Procedure Laterality Date  . THYROIDECTOMY     Family History  Problem Relation Age of Onset  . Alzheimer's disease Mother   . Hypertension Mother   . Diabetes Father   . Hypertension Father   . Thyroid cancer Sister   . Colon cancer Neg Hx   . Esophageal cancer Neg Hx   . Pancreatic cancer Neg Hx   . Stomach cancer Neg Hx    Social History   Tobacco Use  . Smoking status: Never Smoker  . Smokeless tobacco: Never Used  Substance Use Topics  . Alcohol use: Yes    Comment: 1-2 nightly  . Drug use: No   Current Outpatient Medications  Medication Sig Dispense Refill  . amLODipine (NORVASC) 10 MG tablet Take 1 tablet (10 mg total) by mouth daily. 90 tablet 3  . aspirin 81 MG tablet Take 81 mg by mouth daily.    Marland Kitchen levothyroxine (  SYNTHROID, LEVOTHROID) 175 MCG tablet Take 1 tablet (175 mcg total) by mouth daily. 90 tablet 0  . naproxen (NAPROSYN) 500 MG tablet Take 1 tablet (500 mg total) by mouth 2 (two) times daily. (Patient taking differently: Take 500 mg by mouth 2 (two) times daily as needed. ) 30 tablet 0  . omeprazole (PRILOSEC) 20 MG capsule Take 1-2 capsules (20-40 mg total) by mouth 2 (two) times daily. 60 capsule 3  . ranitidine (ZANTAC) 150 MG tablet Take 150 mg by mouth at bedtime.    . tamsulosin (FLOMAX) 0.4 MG CAPS capsule Take 1 capsule (0.4 mg total) by mouth daily. 30 capsule 3  . SUPREP BOWEL PREP KIT 17.5-3.13-1.6 GM/177ML SOLN Suprep-Use as directed 354 mL 0   No current facility-administered medications for this visit.    Allergies  Allergen Reactions  . Isovue [Iopamidol] Hives    Pt states he had an IVP in the 80's and broke out in hives.     . Amoxicillin   . Penicillins   . Keflex  [Cephalexin] Hives     Review of Systems: All systems reviewed and negative except where noted in HPI.    No results found.  Physical Exam: BP (!) 144/80   Pulse 96   Ht '5\' 7"'$  (1.702 m)   Wt 269 lb 9.6 oz (122.3 kg)   BMI 42.23 kg/m  Constitutional: Pleasant,well-developed, male in no acute distress. HEENT: Normocephalic and atraumatic. Conjunctivae are normal. No scleral icterus. Neck supple.  Cardiovascular: Normal rate, regular rhythm.  Pulmonary/chest: Effort normal and breath sounds normal. No wheezing, rales or rhonchi. Abdominal: Soft, protuberant, nontender.  There are no masses palpable. . Extremities: no edema Lymphadenopathy: No cervical adenopathy noted. Neurological: Alert and oriented to person place and time. Skin: Skin is warm and dry. No rashes noted. Psychiatric: Normal mood and affect. Behavior is normal.   ASSESSMENT AND PLAN: 57 year old male here for new patient assessment of the following issues:  GERD / dysphagia / chronic cough - patient with long-standing reflux symptoms and intermittent solid food dysphagia.  He also has chronic cough however it is unclear to me if cough is related to his reflux.  His heartburn is fairly well controlled on current dose of PPI, although his nocturnal symptoms which bother him periodically could be related.  He has had a prior ENT evaluation, it was recommended he have a biopsy was vocal cord as well as scan of his neck which he has not done, I recommended he follow the ENT recommendations regarding that issue.  From a GI standpoint, I offered him an upper endoscopy to evaluate for Barrett's esophagus, active esophagitis, and to evaluate his dysphagia and potentially dilate if needed.  I discussed the risks and benefits of upper endoscopy with him and he wanted to proceed.  In the interim I recommend a trial of increasing his omeprazole to 40 mg twice daily for 1 month to see if this will help his cough and potentially reduce  his nocturnal symptoms. He can stop zantac when he does this. He was agreeable to this.  Further recommendations pending his course and the results of EGD.  Alcohol use - I counseled him with his current level of alcohol intake he is at risk for alcoholic related liver disease.  I do not see any basic labs on file, will check his LFTs today as well as CBC.  Recommend he minimize his alcohol use moving forward.  Colon cancer screening - none prior, he is  average risk for colon cancer but is overdue for screening.  I am recommending an optical colonoscopy performed for screening, we can do this at the same time as his EGD.  Following discussion of risks and benefits of colonoscopy he wanted to proceed.  Sanger Cellar, MD [facility] Pager [phone]  CC: Nuala Alpha, DO

## 2017-04-09 ENCOUNTER — Ambulatory Visit: Payer: BLUE CROSS/BLUE SHIELD | Admitting: Internal Medicine

## 2017-04-16 ENCOUNTER — Encounter: Payer: BLUE CROSS/BLUE SHIELD | Admitting: Gastroenterology

## 2017-06-13 ENCOUNTER — Other Ambulatory Visit: Payer: Self-pay | Admitting: Internal Medicine

## 2017-06-13 ENCOUNTER — Encounter: Payer: Self-pay | Admitting: Internal Medicine

## 2017-06-13 ENCOUNTER — Telehealth: Payer: Self-pay

## 2017-06-13 ENCOUNTER — Encounter: Payer: Self-pay | Admitting: Family Medicine

## 2017-06-13 NOTE — Telephone Encounter (Signed)
Informed patient that his RX was called into the pharmacy. Patient states that he has not done a sleep study because no one ever called him to set it up.  Ozella Almond, [place]

## 2017-07-13 ENCOUNTER — Other Ambulatory Visit: Payer: Self-pay | Admitting: Gastroenterology

## 2017-07-14 NOTE — Telephone Encounter (Signed)
Rec'd refill request from [facility] in [place], [place].  Called and LM for pt to see if he is taking 40 mg once or twice a day. See office note from 04-03-17 visit with Dr. Havery Moros.  Also he had to cancel his EGD due to work.  Does he want to schedule?

## 2017-07-23 ENCOUNTER — Encounter: Payer: Self-pay | Admitting: Family Medicine

## 2017-07-23 ENCOUNTER — Other Ambulatory Visit: Payer: Self-pay

## 2017-07-23 ENCOUNTER — Ambulatory Visit: Payer: BLUE CROSS/BLUE SHIELD | Admitting: Family Medicine

## 2017-07-23 VITALS — BP 136/88 | HR 90 | Temp 98.2°F | Ht 67.0 in | Wt 223.0 lb

## 2017-07-23 DIAGNOSIS — M79645 Pain in left finger(s): Secondary | ICD-10-CM | POA: Diagnosis not present

## 2017-07-23 DIAGNOSIS — E890 Postprocedural hypothyroidism: Secondary | ICD-10-CM | POA: Diagnosis not present

## 2017-07-23 NOTE — Progress Notes (Signed)
Subjective: Chief Complaint  Patient presents with  . weight loss    needs TSH checked and refill on rx has lost 48lb since 3/19  . left thumb pain  . cramping in thighs at night     HPI: Richard Duarte is a 57 y.o. presenting to clinic today to discuss the following:  Hypothyroidism post Thyroidectomy Patient has lost 48lbs in 3 months with low carb diet and exercise. He is concerned that his current level of levothyroxine will be too high given his weight loss. He would like a recheck of his thyroid hormone levels before getting a refill on his medication.  He denies any heart palpitations, diaphoresis, nausea, vomiting, diarrhea, or heat intolerance.  Left Thumb pain Patient says he has been having 2-3 days of sharp left thumb pain that improves with Ibuprofen. It is worse with movements involved in texting. Episodes occurring everyday, several times per day lasting about 5-10 minutes in duration.   Health Maintenance: None addressed today     ROS noted in HPI.   Past Medical, Surgical, Social, and Family History Reviewed & Updated per EMR.   Pertinent Historical Findings include:   Social History   Tobacco Use  Smoking Status Never Smoker  Smokeless Tobacco Never Used    Objective: BP 136/88 (BP Location: Left Arm, Patient Position: Sitting, Cuff Size: Normal)   Pulse 90   Temp 98.2 F (36.8 C) (Oral)   Ht 5\' 7"  (1.702 m)   Wt 223 lb (101.2 kg)   SpO2 98%   BMI 34.93 kg/m  Vitals and nursing notes reviewed  Physical Exam Gen: Alert and Oriented x 3, NAD HEENT: Normocephalic, atraumatic, PERRLA, EOMI, no exophthalmos Neck: trachea midline, no LAD, thyroid not present CV: RRR, no murmurs, normal S1, S2 split Resp: CTAB, no wheezing, rales, or rhonchi, comfortable work of breathing Abd: non-distended, non-tender, soft, +bs in all four quadrants MSK: left thumb TTP at the MTP joint, no snuff box tenderness, 5/5 strength, gross sensation intact, +2  distal radial pulses Ext: no clubbing, cyanosis, or edema Skin: warm, dry, intact, no rashes  No results found for this or any previous visit (from the past 72 hour(s)).  Assessment/Plan:  Pain of left thumb Advised patient to reduce actions that are causing the pain such as texting. Should improve in 5-10 days but if not he could by a thumb splint. If no improvement with rest, ice, compression, and Ibuprofen advised patient to return to office.  Postoperative hypothyroidism Ordered TSH, Free T4 and T3 today. If normal will adjust dose according to weight loss to dose at recommended 1.74mcg/kg/day. At his current weight he should be on 171mcg per day, down from his current dose of 160mcg per day.  TSH and Free T4 were within normal limits. Recheck in 3 months.   I am sending in a new prescription for 150 mcg per day to account for his continued planned weight loss.        PATIENT EDUCATION PROVIDED: See AVS    Diagnosis and plan along with any newly prescribed medication(s) were discussed in detail with this patient today. The patient verbalized understanding and agreed with the plan. Patient advised if symptoms worsen return to clinic or ER.   Health Maintainance:   Orders Placed This Encounter  Procedures  . TSH  . T4, Free  . T3    Meds ordered this encounter  Medications  . levothyroxine (SYNTHROID, LEVOTHROID) 150 MCG tablet  Sig: Take 1 tablet (150 mcg total) by mouth daily.    Dispense:  90 tablet    Refill:  0     Tim Garlan Fillers, DO 07/23/2017, 9:10 AM PGY-2 [place]

## 2017-07-23 NOTE — Patient Instructions (Signed)
It was great to see you today! Thank you for letting me participate in your care!  Today, we discussed your thyroid medication refill. I will recheck your Thyroid labs and call you with results. I will refill your Levothyroxine after the test results and make changes if necessary.  I am very pleased that you have lost weight. Please continue to diet and exercise in moderation and continue your new healthy lifestyle!  Please use rest, ergonomic support, and Ibuprofen for your left thumb pain. Take breaks from typing and using hand held devices.  Be well, Harolyn Rutherford, DO PGY-2, [facility]

## 2017-07-24 LAB — T4, FREE: FREE T4: 1.67 ng/dL (ref 0.82–1.77)

## 2017-07-24 LAB — T3: T3 TOTAL: 66 ng/dL — AB (ref 71–180)

## 2017-07-24 LAB — TSH: TSH: 1.89 u[IU]/mL (ref 0.450–4.500)

## 2017-07-25 ENCOUNTER — Encounter: Payer: Self-pay | Admitting: Family Medicine

## 2017-07-25 ENCOUNTER — Telehealth: Payer: Self-pay | Admitting: *Deleted

## 2017-07-25 NOTE — Telephone Encounter (Signed)
Pt is calling requesting the results of his labs from 07/23/17. Fleeger, Salome Spotted, CMA

## 2017-07-28 ENCOUNTER — Telehealth: Payer: Self-pay

## 2017-07-28 ENCOUNTER — Encounter: Payer: Self-pay | Admitting: Family Medicine

## 2017-07-28 DIAGNOSIS — M79645 Pain in left finger(s): Secondary | ICD-10-CM | POA: Insufficient documentation

## 2017-07-28 MED ORDER — LEVOTHYROXINE SODIUM 150 MCG PO TABS: 165.0000 ug | ORAL_TABLET | Freq: Every day | ORAL | 0 refills | Status: DC

## 2017-07-28 NOTE — Telephone Encounter (Signed)
Pt called nurse line requesting his results be released to his mychart.

## 2017-07-28 NOTE — Assessment & Plan Note (Signed)
Ordered TSH, Free T4 and T3 today. If normal will adjust dose according to weight loss to dose at recommended 1.41mcg/kg/day. At his current weight he should be on 131mcg per day, down from his current dose of 139mcg per day.  TSH and Free T4 were within normal limits. Recheck in 3 months.   I am sending in a new prescription for 150 mcg per day to account for his continued planned weight loss.

## 2017-07-28 NOTE — Assessment & Plan Note (Signed)
Advised patient to reduce actions that are causing the pain such as texting. Should improve in 5-10 days but if not he could by a thumb splint. If no improvement with rest, ice, compression, and Ibuprofen advised patient to return to office.

## 2017-07-30 ENCOUNTER — Other Ambulatory Visit: Payer: Self-pay | Admitting: Family Medicine

## 2017-07-30 MED ORDER — LEVOTHYROXINE SODIUM 175 MCG PO TABS: 175.0000 ug | ORAL_TABLET | Freq: Every day | ORAL | 2 refills | Status: DC

## 2017-07-30 NOTE — Progress Notes (Signed)
Originally planned to decrease patient's dose of Levothyroxine to 145mcg per day given rapid recent weight loss. However, patient provided additional information after his appointment that he had lost significant weight before and did not require a dosing adjustment. Keeping him at 148mcg and will recheck in 6 weeks.

## 2017-08-01 NOTE — Telephone Encounter (Signed)
Error

## 2017-10-06 DIAGNOSIS — E890 Postprocedural hypothyroidism: Secondary | ICD-10-CM | POA: Diagnosis not present

## 2017-10-06 DIAGNOSIS — F1311 Sedative, hypnotic or anxiolytic abuse, in remission: Secondary | ICD-10-CM | POA: Diagnosis not present

## 2017-10-06 DIAGNOSIS — N400 Enlarged prostate without lower urinary tract symptoms: Secondary | ICD-10-CM | POA: Diagnosis not present

## 2017-10-06 DIAGNOSIS — I10 Essential (primary) hypertension: Secondary | ICD-10-CM | POA: Diagnosis not present

## 2018-01-20 DIAGNOSIS — N400 Enlarged prostate without lower urinary tract symptoms: Secondary | ICD-10-CM | POA: Diagnosis not present

## 2018-01-20 DIAGNOSIS — R351 Nocturia: Secondary | ICD-10-CM | POA: Diagnosis not present

## 2018-01-20 DIAGNOSIS — F320 Major depressive disorder, single episode, mild: Secondary | ICD-10-CM | POA: Diagnosis not present

## 2018-01-20 DIAGNOSIS — Z0000 Encounter for general adult medical examination without abnormal findings: Secondary | ICD-10-CM | POA: Diagnosis not present

## 2018-01-20 DIAGNOSIS — Z1159 Encounter for screening for other viral diseases: Secondary | ICD-10-CM | POA: Diagnosis not present

## 2018-01-20 DIAGNOSIS — E890 Postprocedural hypothyroidism: Secondary | ICD-10-CM | POA: Diagnosis not present

## 2018-01-20 DIAGNOSIS — Z13220 Encounter for screening for lipoid disorders: Secondary | ICD-10-CM | POA: Diagnosis not present

## 2018-01-20 DIAGNOSIS — E559 Vitamin D deficiency, unspecified: Secondary | ICD-10-CM | POA: Diagnosis not present

## 2018-01-20 DIAGNOSIS — I10 Essential (primary) hypertension: Secondary | ICD-10-CM | POA: Diagnosis not present

## 2018-05-13 DIAGNOSIS — F331 Major depressive disorder, recurrent, moderate: Secondary | ICD-10-CM | POA: Diagnosis not present

## 2018-05-20 DIAGNOSIS — F331 Major depressive disorder, recurrent, moderate: Secondary | ICD-10-CM | POA: Diagnosis not present

## 2018-05-27 DIAGNOSIS — F331 Major depressive disorder, recurrent, moderate: Secondary | ICD-10-CM | POA: Diagnosis not present

## 2018-07-21 ENCOUNTER — Encounter: Payer: Self-pay | Admitting: Family Medicine

## 2018-08-19 ENCOUNTER — Encounter: Payer: Self-pay | Admitting: Family Medicine

## 2018-08-20 ENCOUNTER — Other Ambulatory Visit: Payer: Self-pay | Admitting: Family Medicine

## 2018-08-20 ENCOUNTER — Other Ambulatory Visit: Payer: Self-pay

## 2018-08-20 ENCOUNTER — Other Ambulatory Visit: Payer: BC Managed Care – PPO

## 2018-08-20 ENCOUNTER — Encounter: Payer: Self-pay | Admitting: Family Medicine

## 2018-08-20 DIAGNOSIS — R5383 Other fatigue: Secondary | ICD-10-CM | POA: Diagnosis not present

## 2018-08-20 NOTE — Progress Notes (Signed)
TSH 

## 2018-08-21 LAB — TSH+FREE T4
Free T4: 1.47 ng/dL (ref 0.82–1.77)
TSH: 12.5 u[IU]/mL — ABNORMAL HIGH (ref 0.450–4.500)

## 2018-08-24 LAB — TESTOSTERONE, FREE, TOTAL, SHBG
Sex Hormone Binding: 29 nmol/L (ref 19.3–76.4)
Testosterone, Free: 9.5 pg/mL (ref 7.2–24.0)
Testosterone: 363 ng/dL (ref 264–916)

## 2018-08-27 ENCOUNTER — Encounter: Payer: Self-pay | Admitting: Family Medicine

## 2018-08-31 ENCOUNTER — Other Ambulatory Visit: Payer: Self-pay | Admitting: Family Medicine

## 2018-08-31 MED ORDER — LEVOTHYROXINE SODIUM 200 MCG PO TABS: 200.0000 ug | ORAL_TABLET | Freq: Every day | ORAL | 1 refills | Status: DC

## 2018-08-31 NOTE — Progress Notes (Signed)
TSH elevated above 10 at 12.5 for patient with known hypothyroidism and on levothyroxine 141mcg.   Increasing to 232mcg. Recheck labs in one month.

## 2018-09-01 MED ORDER — LEVOTHYROXINE SODIUM 200 MCG PO TABS: 200.0000 ug | ORAL_TABLET | Freq: Every day | ORAL | 1 refills | Status: DC

## 2018-09-01 NOTE — Progress Notes (Signed)
Sent to wrong pharmacy.  Resent to correct one. Christen Bame, CMA

## 2018-09-01 NOTE — Addendum Note (Signed)
Addended by: Christen Bame D on: 09/01/2018 10:55 AM   Modules accepted: Orders

## 2018-09-25 ENCOUNTER — Other Ambulatory Visit: Payer: Self-pay | Admitting: Family Medicine

## 2018-09-25 ENCOUNTER — Encounter: Payer: Self-pay | Admitting: Family Medicine

## 2018-09-25 MED ORDER — LEVOTHYROXINE SODIUM 200 MCG PO TABS: 200.0000 ug | ORAL_TABLET | Freq: Every day | ORAL | 0 refills | Status: DC

## 2018-09-25 NOTE — Progress Notes (Signed)
Patient requested refill via MyChart. Informed him to schedule a lab visit early October to recheck TSH on new dose of synthroid.

## 2018-09-28 ENCOUNTER — Encounter: Payer: Self-pay | Admitting: Family Medicine

## 2018-09-29 ENCOUNTER — Encounter: Payer: Self-pay | Admitting: Family Medicine

## 2018-09-30 ENCOUNTER — Telehealth: Payer: Self-pay | Admitting: *Deleted

## 2018-09-30 NOTE — Telephone Encounter (Signed)
Pt is calling in response to mychart message about derm clinic appt.    Nothing available until Novembers schedule comes out.  Will forward to PCP for next steps.  Christen Bame, CMA

## 2018-10-01 NOTE — Telephone Encounter (Signed)
Pt made appt for Monday afternoon.  He states that they are causing him discomfort and does not want to wait till November. Christen Bame, CMA

## 2018-10-01 NOTE — Telephone Encounter (Signed)
Lockamy, Timothy, DO  You 20 hours ago (1:50 PM)   Hey, he just has some skin tags that need to be removed. Its not urgent so if he is willing to wait that is fine. If he wants to have them removed earlier he can schedule an appointment in October.

## 2018-10-05 ENCOUNTER — Encounter: Payer: Self-pay | Admitting: Family Medicine

## 2018-10-05 ENCOUNTER — Ambulatory Visit: Payer: BC Managed Care – PPO | Admitting: Family Medicine

## 2018-10-23 ENCOUNTER — Encounter: Payer: Self-pay | Admitting: Family Medicine

## 2018-11-24 ENCOUNTER — Other Ambulatory Visit: Payer: Self-pay | Admitting: Family Medicine

## 2018-11-24 ENCOUNTER — Encounter: Payer: Self-pay | Admitting: Family Medicine

## 2018-11-24 MED ORDER — AMLODIPINE BESYLATE 10 MG PO TABS: 10.0000 mg | ORAL_TABLET | Freq: Every day | ORAL | 3 refills | Status: DC

## 2018-11-24 NOTE — Progress Notes (Signed)
Patient requested 90 day refill via MyChart.

## 2018-12-22 ENCOUNTER — Encounter: Payer: Self-pay | Admitting: Family Medicine

## 2018-12-23 ENCOUNTER — Other Ambulatory Visit: Payer: Self-pay | Admitting: Family Medicine

## 2018-12-23 MED ORDER — LEVOTHYROXINE SODIUM 200 MCG PO TABS: 200.0000 ug | ORAL_TABLET | Freq: Every day | ORAL | 0 refills | Status: DC

## 2018-12-23 NOTE — Progress Notes (Signed)
Patient requested Levothyroxine refilling

## 2019-01-21 ENCOUNTER — Encounter: Payer: Self-pay | Admitting: Family Medicine

## 2019-02-08 DIAGNOSIS — M25561 Pain in right knee: Secondary | ICD-10-CM | POA: Diagnosis not present

## 2019-02-08 DIAGNOSIS — M25462 Effusion, left knee: Secondary | ICD-10-CM | POA: Diagnosis not present

## 2019-02-08 DIAGNOSIS — Z6835 Body mass index (BMI) 35.0-35.9, adult: Secondary | ICD-10-CM | POA: Diagnosis not present

## 2019-02-08 DIAGNOSIS — M25562 Pain in left knee: Secondary | ICD-10-CM | POA: Diagnosis not present

## 2019-03-26 ENCOUNTER — Encounter: Payer: Self-pay | Admitting: Family Medicine

## 2019-03-30 ENCOUNTER — Other Ambulatory Visit: Payer: Self-pay | Admitting: Family Medicine

## 2019-03-30 MED ORDER — LEVOTHYROXINE SODIUM 200 MCG PO TABS: 200.0000 ug | ORAL_TABLET | Freq: Every day | ORAL | 0 refills | Status: DC

## 2019-04-22 ENCOUNTER — Encounter: Payer: Self-pay | Admitting: Family Medicine

## 2019-04-25 ENCOUNTER — Encounter: Payer: Self-pay | Admitting: Family Medicine

## 2019-04-27 ENCOUNTER — Telehealth: Payer: Self-pay

## 2019-04-27 ENCOUNTER — Encounter: Payer: Self-pay | Admitting: Family Medicine

## 2019-04-27 NOTE — Telephone Encounter (Signed)
Patient calls nurse line regarding concerns after receiving second COVID vaccine. Patient reports that he began running fever at 5 pm on Friday afternoon (04/16/19). Patient states that fever breaks during the day and returns in the evening. He has been treating with tylenol and motrin. Tmax 101.4. Patient has been checking temporally. Patient also reports fatigue and inconsistent cough. Patient declines difficulty breathing or shortness of breath.   Due to length of time patient has been running fever, scheduled virtual visit with PCP for tomorrow afternoon.   ED precautions given  To PCP  Talbot Grumbling, RN

## 2019-04-28 ENCOUNTER — Encounter: Payer: Self-pay | Admitting: Family Medicine

## 2019-04-28 ENCOUNTER — Telehealth (INDEPENDENT_AMBULATORY_CARE_PROVIDER_SITE_OTHER): Payer: BC Managed Care – PPO | Admitting: Family Medicine

## 2019-04-28 ENCOUNTER — Other Ambulatory Visit: Payer: Self-pay

## 2019-04-28 DIAGNOSIS — Z5329 Procedure and treatment not carried out because of patient's decision for other reasons: Secondary | ICD-10-CM

## 2019-04-28 DIAGNOSIS — R509 Fever, unspecified: Secondary | ICD-10-CM

## 2019-04-28 NOTE — Progress Notes (Signed)
Spoke with patient via Mychart and he declines visit today. He states his fever, chills, and body aches have resolved. Will no charge.

## 2019-04-30 ENCOUNTER — Other Ambulatory Visit: Payer: Self-pay | Admitting: Family Medicine

## 2019-04-30 MED ORDER — LORATADINE 10 MG PO TBDP: 10.0000 mg | ORAL_TABLET | Freq: Every day | ORAL | 12 refills | Status: DC

## 2019-04-30 MED ORDER — LORATADINE 10 MG PO TABS: 10.0000 mg | ORAL_TABLET | Freq: Every day | ORAL | 11 refills | Status: AC

## 2019-04-30 NOTE — Progress Notes (Signed)
Patient called and stated Loratadine sent in earlier today was not at the pharmacy. Sending it in a second time.

## 2019-04-30 NOTE — Progress Notes (Signed)
Patient with dry cough ongoing for a few weeks since pollen started. Will treat for allergies and have patient come in to clinic if no improvement.

## 2019-08-02 ENCOUNTER — Encounter: Payer: Self-pay | Admitting: Family Medicine

## 2019-08-05 ENCOUNTER — Other Ambulatory Visit: Payer: Self-pay | Admitting: Family Medicine

## 2019-08-05 MED ORDER — LEVOTHYROXINE SODIUM 200 MCG PO TABS: 200.0000 ug | ORAL_TABLET | Freq: Every day | ORAL | 0 refills | Status: DC

## 2019-08-23 ENCOUNTER — Ambulatory Visit: Payer: BC Managed Care – PPO | Admitting: Family Medicine

## 2019-09-28 ENCOUNTER — Encounter: Payer: Self-pay | Admitting: Family Medicine

## 2019-10-04 ENCOUNTER — Other Ambulatory Visit: Payer: Self-pay | Admitting: Family Medicine

## 2019-10-04 ENCOUNTER — Other Ambulatory Visit: Payer: BC Managed Care – PPO

## 2019-10-04 ENCOUNTER — Other Ambulatory Visit: Payer: Self-pay

## 2019-10-04 ENCOUNTER — Encounter: Payer: Self-pay | Admitting: Family Medicine

## 2019-10-04 DIAGNOSIS — Z0000 Encounter for general adult medical examination without abnormal findings: Secondary | ICD-10-CM | POA: Diagnosis not present

## 2019-10-04 DIAGNOSIS — E876 Hypokalemia: Secondary | ICD-10-CM | POA: Diagnosis not present

## 2019-10-04 DIAGNOSIS — E890 Postprocedural hypothyroidism: Secondary | ICD-10-CM | POA: Diagnosis not present

## 2019-10-04 DIAGNOSIS — I10 Essential (primary) hypertension: Secondary | ICD-10-CM

## 2019-10-04 NOTE — Addendum Note (Signed)
Addended by: Caralee Ates on: 10/04/2019 10:24 AM   Modules accepted: Orders

## 2019-10-05 ENCOUNTER — Encounter: Payer: Self-pay | Admitting: Family Medicine

## 2019-10-05 LAB — BASIC METABOLIC PANEL
BUN/Creatinine Ratio: 18 (ref 10–24)
BUN: 21 mg/dL (ref 8–27)
CO2: 23 mmol/L (ref 20–29)
Calcium: 9.9 mg/dL (ref 8.6–10.2)
Chloride: 103 mmol/L (ref 96–106)
Creatinine, Ser: 1.16 mg/dL (ref 0.76–1.27)
GFR calc Af Amer: 78 mL/min/{1.73_m2} (ref >59)
GFR calc non Af Amer: 68 mL/min/{1.73_m2} (ref >59)
Glucose: 125 mg/dL — ABNORMAL HIGH (ref 65–99)
Potassium: 4.1 mmol/L (ref 3.5–5.2)
Sodium: 143 mmol/L (ref 134–144)

## 2019-10-05 LAB — LIPID PANEL
Chol/HDL Ratio: 5.2 ratio — ABNORMAL HIGH (ref 0.0–5.0)
Cholesterol, Total: 283 mg/dL — ABNORMAL HIGH (ref 100–199)
HDL: 54 mg/dL (ref >39)
LDL Chol Calc (NIH): 183 mg/dL — ABNORMAL HIGH (ref 0–99)
Triglycerides: 240 mg/dL — ABNORMAL HIGH (ref 0–149)
VLDL Cholesterol Cal: 46 mg/dL — ABNORMAL HIGH (ref 5–40)

## 2019-10-05 LAB — TSH: TSH: 1.18 u[IU]/mL (ref 0.450–4.500)

## 2019-10-07 ENCOUNTER — Other Ambulatory Visit: Payer: Self-pay | Admitting: Family Medicine

## 2019-10-07 DIAGNOSIS — E890 Postprocedural hypothyroidism: Secondary | ICD-10-CM

## 2019-10-07 MED ORDER — LEVOTHYROXINE SODIUM 200 MCG PO TABS: 200.0000 ug | ORAL_TABLET | Freq: Every day | ORAL | 0 refills | Status: DC

## 2019-10-19 ENCOUNTER — Ambulatory Visit: Payer: BC Managed Care – PPO | Admitting: Family Medicine

## 2019-10-26 ENCOUNTER — Ambulatory Visit: Payer: BC Managed Care – PPO | Admitting: Family Medicine

## 2019-10-26 ENCOUNTER — Other Ambulatory Visit: Payer: Self-pay

## 2019-10-26 VITALS — BP 162/87 | HR 98 | Ht 66.5 in | Wt 265.6 lb

## 2019-10-26 DIAGNOSIS — E785 Hyperlipidemia, unspecified: Secondary | ICD-10-CM

## 2019-10-26 DIAGNOSIS — I10 Essential (primary) hypertension: Secondary | ICD-10-CM

## 2019-10-26 DIAGNOSIS — R7309 Other abnormal glucose: Secondary | ICD-10-CM | POA: Diagnosis not present

## 2019-10-26 DIAGNOSIS — Z0000 Encounter for general adult medical examination without abnormal findings: Secondary | ICD-10-CM

## 2019-10-26 DIAGNOSIS — Z23 Encounter for immunization: Secondary | ICD-10-CM

## 2019-10-26 MED ORDER — HYDROCHLOROTHIAZIDE 12.5 MG PO CAPS: 12.5000 mg | ORAL_CAPSULE | Freq: Every day | ORAL | 2 refills | Status: DC

## 2019-10-26 MED ORDER — ATORVASTATIN CALCIUM 40 MG PO TABS: 40.0000 mg | ORAL_TABLET | Freq: Every day | ORAL | 3 refills | Status: AC

## 2019-10-26 NOTE — Progress Notes (Signed)
    SUBJECTIVE:   CHIEF COMPLAINT / HPI:   [name] is a 59 yo M who presents to discuss recent results and receive immunizations. Today he would like to receive the flu and covid vaccine.   Hypertension: - Medications: Amlodipine 10 mg daily. - Compliance: Yes - Checking BP at home: No - Denies any SOB, CP, vision changes, LE edema, medication SEs, or symptoms of hypotension  Elevated glucose level No previous diagnosis of diabetes. Family history of diabetes. Endorse recent weight gain after passing of partner last year. Denies polyphagia, polydipsia, polyuria.   HLD Not currently taking a statin due to concern of side effects. Taking a variety of supplements mot importantly cholestoff.   PERTINENT  PMH / PSH: Postoperative hypothyroidism (TSH 1.180 10/04/19), Obesity  OBJECTIVE:   BP (!) 162/87   Pulse 98   Ht 5' 6.5" (1.689 m)   Wt 265 lb 9.6 oz (120.5 kg)   SpO2 96%   BMI 42.23 kg/m   General: Appears well, no acute distress. Age appropriate. Cardiac: RRR, normal heart sounds, no murmurs Respiratory: CTAB, normal effort Abdomen: soft, nontender, mildly distended due to patient body habitus Extremities: No edema or cyanosis. Skin: Warm and dry, no rashes noted Neuro: alert and oriented, no focal deficits Psych: normal affect  ASSESSMENT/PLAN:   1. Essential hypertension BP not at goal. Asymptomatic - Start hydrochlorothiazide (MICROZIDE) 12.5 MG capsule; Take 1 capsule (12.5 mg total) by mouth daily.  Dispense: 30 capsule; Refill: 2 -Continue amlodipine 10 mg daily -Follow up 1 week for BP check  2. Elevated glucose level - Hemoglobin A1c  3. Hyperlipidemia, unspecified hyperlipidemia type ASCVD risk 23% - atorvastatin (LIPITOR) 40 MG tablet; Take 1 tablet (40 mg total) by mouth daily.  Dispense: 90 tablet; Refill: 3  4. Health care maintenance Needs colonoscopy. Declines at this time.  - HIV antibody (with reflex) - HCV Ab w Reflex to Quant PCR  5.  Encounter for immunization - [employer] Vaccine  6. Need for immunization against influenza - Flu Vaccine QUAD 36+ mos IM  Gerlene Fee, [place]

## 2019-10-26 NOTE — Patient Instructions (Addendum)
It was wonderful to see you today.  Please bring ALL of your medications with you to every visit.   Today we talked about:  Your cholesterol. We started Lipitor 40 mg daily.  Your blood pressure. Continue amlodipine. Start hydrochlorothiazide 12.5 mg daily. Please follow up in 1 week for a BP check.   We obtained a lab to look at the trend of your blood sugar.   I will call you if any labs are abnormal otherwise if normal I will communicate via Mychart.   Please be sure to schedule follow up at the front  desk before you leave today.   Please call the clinic at [phone] if your symptoms worsen or you have any concerns. It was our pleasure to serve you.  Dr. Janus Molder

## 2019-10-27 DIAGNOSIS — R7309 Other abnormal glucose: Secondary | ICD-10-CM | POA: Insufficient documentation

## 2019-10-27 DIAGNOSIS — E785 Hyperlipidemia, unspecified: Secondary | ICD-10-CM | POA: Insufficient documentation

## 2019-10-27 LAB — HEMOGLOBIN A1C
Est. average glucose Bld gHb Est-mCnc: 117 mg/dL
Hgb A1c MFr Bld: 5.7 % — ABNORMAL HIGH (ref 4.8–5.6)

## 2019-10-27 LAB — HCV INTERPRETATION

## 2019-10-27 LAB — HIV ANTIBODY (ROUTINE TESTING W REFLEX): HIV Screen 4th Generation wRfx: NONREACTIVE

## 2019-10-27 LAB — HCV AB W REFLEX TO QUANT PCR: HCV Ab: <0.1 s/co ratio (ref 0.0–0.9)

## 2019-11-02 ENCOUNTER — Encounter: Payer: Self-pay | Admitting: Family Medicine

## 2019-11-02 ENCOUNTER — Ambulatory Visit: Payer: BC Managed Care – PPO | Admitting: Family Medicine

## 2019-11-02 ENCOUNTER — Other Ambulatory Visit: Payer: Self-pay

## 2019-11-02 VITALS — BP 141/80 | HR 107 | Ht 67.0 in | Wt 260.0 lb

## 2019-11-02 DIAGNOSIS — F4320 Adjustment disorder, unspecified: Secondary | ICD-10-CM | POA: Insufficient documentation

## 2019-11-02 DIAGNOSIS — I10 Essential (primary) hypertension: Secondary | ICD-10-CM | POA: Diagnosis not present

## 2019-11-02 DIAGNOSIS — E6609 Other obesity due to excess calories: Secondary | ICD-10-CM

## 2019-11-02 DIAGNOSIS — F4321 Adjustment disorder with depressed mood: Secondary | ICD-10-CM | POA: Diagnosis not present

## 2019-11-02 DIAGNOSIS — E785 Hyperlipidemia, unspecified: Secondary | ICD-10-CM

## 2019-11-02 DIAGNOSIS — Z6841 Body Mass Index (BMI) 40.0 and over, adult: Secondary | ICD-10-CM

## 2019-11-02 NOTE — Assessment & Plan Note (Signed)
Still above goal of <130/80, however improved since last visit with initiation of HCTZ.  Recommended continued HCTZ 12.5 mg in addition to Norvasc 10 mg as is, monitor BP periodically at home with HTN app.  Appears motivated for weight loss, suspect that will certainly contribute to blood pressure decrease. Placed referral to dietitian per patient request and encouraged increasing physical activity as possible.

## 2019-11-02 NOTE — Patient Instructions (Signed)
It was so wonderful to meet you today.  I am glad to hear that you are feeling a little bit better with your blood pressure medicine, keep checking your blood pressure every 2-3 days and keep it in your app.  Goal for you would be around 130/80.   I encourage you to try to work towards following a more balanced balanced diet as I know you are motivated to do so and try to become physically active about 20-30 minutes 3--5 days a week.    Therapy and Counseling Resources Most providers on this list will take Medicaid. Patients with commercial insurance or Medicare should contact their insurance company to get a list of in network providers.  [facility]:Psychiatry and Counseling [facility] [place]. [place], [place] [address] [place]  [address] [place], [place] [address]      [place] [address]  [place], [place] [address] [phone]  [place] [address], [place]  [place], [place] [address]       [phone]      Jinny Blossom [facility] [address] [place], [place]  Family Solutions:  [place]. [place] [phone]  [facility]:  [place] [address] [place] [phone]  [facility] (under & uninsured) [address], [place] [phone]    [email or web address]    [place] [address] . [place]    [phone]  [facility]     Phone:  [phone]     [facility]  [place] [address]      [place], [place] ext [place]: [place], [place], [place]   [place] ([place] therapist) [email or web address]  [place] [address]    [place] [address]    [phone]    [facility]   [address],  [place], [place]   [place]  [place]  [facility]  [address] [place], [place]        [phone]  Open Access/Walk In Clinic under & uninsured  [facility]  [address] [place], [place] Crisis [phone]  [facility] [place],  ([place])   [place], [place]: [phone]) 8:30 - 12; 1 - 2:30  [facility] the [place],  [place], [place]    [phone]):8:30 - 12; 2 - 3PM  [facility] [employer],  [address]  [place]; [phone]):   Mon - Fri 8 AM - 5 PM  [facility] [place]  MWF 12:30 to 3:00 or call to schedule an appointment  [phone]  Specific Provider options Psychology Today  [email or web address] 1. click on find a therapist  2. enter your zip code 3. left side and select or tailor a therapist for your specific need.   [facility] Provider Directory [email or web address]  (Medicaid)   Follow all drop down to find a provider  [place] or [email or web address] [address], [place], [place] Recovery support and educational  24- Hour Availability:  .  [facility]  . [place], [place] Crisis [phone]  . [facility] of the [employer] [phone]  [facility] Crisis Service  [phone]   . [place]  [phone] (after hours)  . Therapeutic Alternative/Mobile Crisis   [phone]  . [place] National Suicide Hotline  978-195-9326 ([place])  . Call 911 or go to emergency room  . [employer]  [phone]);  [place] and [employer]    . [facility] ACCESS  [phone]); [place], [place], [place], [place], [place], [place], [place]

## 2019-11-02 NOTE — Assessment & Plan Note (Signed)
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  Provided supportive listening. Provided therapy resources to establish in the community, aware of crisis hotline.

## 2019-11-02 NOTE — Progress Notes (Signed)
° ° °  SUBJECTIVE:   CHIEF COMPLAINT / HPI: BP Check   Richard Duarte is a 59 year old gentleman presenting for a hypertension follow-up.  He was seen by Dr. Janus Duarte on 10/19 and started HCTZ 12.5 mg in addition to his home Norvasc 10 mg.  He reports he is feeling better with HCTZ on board, has noticed his blood pressure seems a little bit lower.  Through his BP app connected to his home machine, on average his systolic is ranged 878-676.  He is very motivated to start back on a more well-balanced diet to lose weight.  Denies any chest pain/SOB/palpitations.  He reports he gained weight after his partner passed away in 2018-05-07.  Used to be 175 pounds and got to that weight by using the Atkins diet.  When he was down he got off of his blood pressure medicines and is hoping to do this again.  He would be interested in speaking with nutritionist.  Non-smoker.  His partner had MS and help take care of him for 2 years prior to passing away last year, he expresses grief and lack of good support currently.  He would be interested in meeting with a therapist.  Denies SI/HI.    Office Visit from 10/26/2019 in [place]  Thoughts that you would be better off dead, or of hurting yourself in some way Not at all  PHQ-9 Total Score 0      PERTINENT  PMH / PSH: Postoperative hypothyroidism elevated BMI, hypertension, hyperlipidemia  OBJECTIVE:   BP (!) 141/80    Pulse (!) 107    Ht 5\' 7"  (1.702 m)    Wt 260 lb (117.9 kg)    SpO2 96%    BMI 40.72 kg/m   General: Alert, NAD HEENT: NCAT, MMM Cardiac: RRR no m/g/r Lungs: Clear bilaterally, no increased WOB on RA Msk: Moves all extremities spontaneously  Ext: Warm, dry, 2+ distal pulses, pedal edema bilaterally Psych: Normal mood and affect, well-groomed, engaging conversation appropriately with good eye contact.  ASSESSMENT/PLAN:   Essential hypertension Still above goal of <130/80, however improved since last visit with initiation  of HCTZ.  Recommended continued HCTZ 12.5 mg in addition to Norvasc 10 mg as is, monitor BP periodically at home with HTN app.  Appears motivated for weight loss, suspect that will certainly contribute to blood pressure decrease. Placed referral to dietitian per patient request and encouraged increasing physical activity as possible.  Hyperlipidemia Tolerating newly started atorvastatin well.  Recheck lipid panel in approximately 3 months.  Grief reaction Partner passed away in 05/07/18 for whom he was the primary caretaker for, was unable to attend his funeral to say his final goodbyes due to undesirable family dynamics.  Provided supportive listening. Provided therapy resources to establish in the community, aware of crisis hotline.  BMI 40.0-44.9, adult [facility]) Patient discussed decreasing calories and trialing low-carb to lose weight (as he has been successful with this in the past) which will certainly benefit his hyperglycemia and hypertension as well.  Provided encouragement, placed referral to dietitian as above.    Follow-up in 1 month if blood pressure improving, otherwise follow-up in 2-3 weeks if persistently above goal.  Richard Duarte, [place]

## 2019-11-02 NOTE — Assessment & Plan Note (Signed)
Patient discussed decreasing calories and trialing low-carb to lose weight (as he has been successful with this in the past) which will certainly benefit his hyperglycemia and hypertension as well.  Provided encouragement, placed referral to dietitian as above.

## 2019-11-02 NOTE — Assessment & Plan Note (Signed)
Tolerating newly started atorvastatin well.  Recheck lipid panel in approximately 3 months.

## 2019-11-03 DIAGNOSIS — F438 Other reactions to severe stress: Secondary | ICD-10-CM | POA: Diagnosis not present

## 2019-11-04 ENCOUNTER — Telehealth: Payer: Self-pay

## 2019-11-04 NOTE — Telephone Encounter (Signed)
I did not receive any request. Who did he talk to?

## 2019-11-04 NOTE — Telephone Encounter (Signed)
Patient calls nurse line to verify that request was received for change in PCP from Dr. Janus Molder to Dr. Higinio Plan.   Forwarding to [place] and Dr. Gwendlyn Deutscher.   Thanks!  Talbot Grumbling, RN

## 2019-11-07 ENCOUNTER — Other Ambulatory Visit: Payer: Self-pay | Admitting: Family Medicine

## 2019-11-07 ENCOUNTER — Encounter: Payer: Self-pay | Admitting: Family Medicine

## 2019-11-07 DIAGNOSIS — E890 Postprocedural hypothyroidism: Secondary | ICD-10-CM

## 2019-11-08 ENCOUNTER — Other Ambulatory Visit: Payer: Self-pay | Admitting: Family Medicine

## 2019-11-08 DIAGNOSIS — E890 Postprocedural hypothyroidism: Secondary | ICD-10-CM

## 2019-11-08 MED ORDER — LEVOTHYROXINE SODIUM 200 MCG PO TABS: 200.0000 ug | ORAL_TABLET | Freq: Every day | ORAL | 1 refills | Status: DC

## 2019-11-08 NOTE — Telephone Encounter (Signed)
The request is in my office in the PCP change folder.  I will review this afternoon. Christen Bame, CMA

## 2019-11-09 ENCOUNTER — Encounter: Payer: Self-pay | Admitting: Family Medicine

## 2019-11-15 ENCOUNTER — Encounter: Payer: Self-pay | Admitting: Family Medicine

## 2019-11-18 ENCOUNTER — Ambulatory Visit: Payer: BC Managed Care – PPO | Admitting: Family Medicine

## 2019-11-23 ENCOUNTER — Telehealth: Payer: Self-pay | Admitting: *Deleted

## 2019-11-23 NOTE — Telephone Encounter (Signed)
Patient completed a switch PCP form.  I called to discuss request with patient. He states several times that he "wishes Dr.Autry-Lott well, but we just do not click".  He reports that he does not want to be a problem or be labeled a problem patient and I assured him that this will not happen.  Pt also had questions about why his last PCP changed. I explained that we are a residency and our providers are only here 3 years and then move on.  He is ok with this but would like a provider who will be here 3 years.  He is aware of one time PCP change.  Dr. Arby Barrette has agreed to take over care.  Switched in [place].  LMOVM informing patient.  Christen Bame, CMA

## 2019-12-10 ENCOUNTER — Ambulatory Visit: Payer: BC Managed Care – PPO

## 2019-12-15 ENCOUNTER — Ambulatory Visit: Payer: BC Managed Care – PPO | Admitting: Family Medicine

## 2020-01-21 ENCOUNTER — Other Ambulatory Visit: Payer: Self-pay | Admitting: Family Medicine

## 2020-01-21 ENCOUNTER — Encounter: Payer: Self-pay | Admitting: Family Medicine

## 2020-02-04 ENCOUNTER — Other Ambulatory Visit: Payer: Self-pay | Admitting: Family Medicine

## 2020-02-04 ENCOUNTER — Encounter: Payer: Self-pay | Admitting: Family Medicine

## 2020-02-04 DIAGNOSIS — I10 Essential (primary) hypertension: Secondary | ICD-10-CM

## 2020-02-25 ENCOUNTER — Ambulatory Visit: Payer: BC Managed Care – PPO | Admitting: Family Medicine

## 2020-05-04 ENCOUNTER — Encounter: Payer: Self-pay | Admitting: Family Medicine

## 2020-05-05 ENCOUNTER — Other Ambulatory Visit: Payer: Self-pay

## 2020-05-05 MED ORDER — AMLODIPINE BESYLATE 10 MG PO TABS: 1.0000 | ORAL_TABLET | Freq: Every day | ORAL | 0 refills | Status: DC

## 2020-05-08 ENCOUNTER — Encounter: Payer: Self-pay | Admitting: Family Medicine

## 2020-05-08 NOTE — Telephone Encounter (Signed)
LVM for patient to follow up with PCP for further refills.  Ozella Almond

## 2020-05-09 ENCOUNTER — Other Ambulatory Visit: Payer: Self-pay | Admitting: Family Medicine

## 2020-05-09 DIAGNOSIS — E890 Postprocedural hypothyroidism: Secondary | ICD-10-CM

## 2020-05-15 ENCOUNTER — Other Ambulatory Visit: Payer: Self-pay | Admitting: Family Medicine

## 2020-05-15 DIAGNOSIS — I10 Essential (primary) hypertension: Secondary | ICD-10-CM

## 2020-05-25 ENCOUNTER — Ambulatory Visit: Payer: BC Managed Care – PPO | Admitting: Family Medicine

## 2020-06-13 ENCOUNTER — Encounter: Payer: Self-pay | Admitting: Family Medicine

## 2020-06-13 ENCOUNTER — Other Ambulatory Visit: Payer: Self-pay

## 2020-06-13 ENCOUNTER — Ambulatory Visit: Payer: BC Managed Care – PPO | Admitting: Family Medicine

## 2020-06-13 VITALS — BP 153/82 | HR 109 | Wt 266.6 lb

## 2020-06-13 DIAGNOSIS — R059 Cough, unspecified: Secondary | ICD-10-CM | POA: Diagnosis not present

## 2020-06-13 DIAGNOSIS — R7309 Other abnormal glucose: Secondary | ICD-10-CM | POA: Diagnosis not present

## 2020-06-13 DIAGNOSIS — E890 Postprocedural hypothyroidism: Secondary | ICD-10-CM | POA: Diagnosis not present

## 2020-06-13 DIAGNOSIS — E785 Hyperlipidemia, unspecified: Secondary | ICD-10-CM

## 2020-06-13 DIAGNOSIS — Z23 Encounter for immunization: Secondary | ICD-10-CM

## 2020-06-13 LAB — POCT GLYCOSYLATED HEMOGLOBIN (HGB A1C): Hemoglobin A1C: 5.8 % — AB (ref 4.0–5.6)

## 2020-06-13 MED ORDER — ZOSTER VAC RECOMB ADJUVANTED 50 MCG/0.5ML IM SUSR
0.5000 mL | Freq: Once | INTRAMUSCULAR | 0 refills | Status: AC
Start: 2020-06-13 — End: 2020-06-13

## 2020-06-13 NOTE — Progress Notes (Signed)
    SUBJECTIVE:   CHIEF COMPLAINT / HPI:   Richard Duarte is a 60 yo who presents to meet PCP.   Obesity: He is requesting to check his A1c since he was in the prediabetic range last October. Reports family history of diabetes. States he has been trying to lose weight- is currently doing the Keto diet, though he admits to gaining 10 pounds recently due to his sister staying with him and cooking good meals. Is trying to increase physical activity as well.   Hypertension: BP 153/82 in clinic. He states he has been checking his BP periodically at home and last 2 measurements were in the low 140s/80s. Denies CP, SOB. Discussed potentially needing to increase BP medication at next visit   Cough: Patient endorses a lingering dry cough he believes may be his allergies. States it comes comes and goes but recently has been more persistent worse at ngiht off and on for majority of this year. Stopped for about 2 months. Denies recent fevers, difficulty breathing, SOB. Requests a COVID test. Endorses having a sleep study done a while ago and believes he does have sleep apnea but is not on any treatment. Unknown if he previously had COVID but feels that he may have had it July of 2020. Previously was getting bronchitis every year, which resolved when his air filters were cleaned.   Grief: Partner passed away in 05/18/2018 and he states that was very hard on him. Endorses going to therapy in the past and is now feeling a lot better. Denies current therapy    OBJECTIVE:   BP (!) 153/82   Pulse (!) 109   Wt 266 lb 9.6 oz (120.9 kg)   SpO2 95%   BMI 41.76 kg/m   Pulse on repeat check 88  General: alert, pleasant, NAD HEENT: MMM. Oropharynx non erythematous and without lesions  CV: RRR no murmurs Resp: CTAB normal WOB    ASSESSMENT/PLAN:   No problem-specific Assessment & Plan notes found for this encounter.   Cough Non productive. Denies fever, SOB, throat pain. Worse at night. Currently takes  Claritin. Requesting COVID testing  - continue Claritin 10mg  daily  - f/u COVID results - consider sleep study   Hypertension BP: 153/82. Asymptomatic  Currently on HCTZ 12.5 and Norvasc 10mg   - continue HCTZ and Noravasc - f/u in 4 weeks to recheck and potentially increase medication dose   Hyperlipidemia Last lipid panel 09/2019: total chol 284, LDL 183, Triglycerides 240. Atorvastatin started 10/2019 - repeat lipid panel - continue atorvastatin 40mg  daily   Postoperative hypothyroidism  Last TSH 1.180 on 10/04/19. Currently taking Euthyrox 264mcg daily before breakfast  - repeat TSH - continue Euthyrox   Immunizations  Sent Rx for Shingrex     Follow up in 4 weeks for BP check and COVID booster   [place]

## 2020-06-13 NOTE — Patient Instructions (Signed)
It was great seeing you today!  Today you came in for a check up.  We are checking your A1c, lipid panel, and TSH  Please check-out at the front desk before leaving the clinic. I'd like to see you back in about 4 weeks to follow up on blood pressure, but if you need to be seen earlier than that for any new issues we're happy to fit you in, just give [place] a call!  Visit Reminders: - Stop by the pharmacy to get your Singles vaccine  - Continue to work on your healthy eating habits and incorporating exercise into your daily life.   Please bring all of your medications with you to each visit.    If you haven't already, sign up for My Chart to have easy access to your labs results, and communication with your primary care physician.  Feel free to call with any questions or concerns at any time, at [phone].   Take care,  Dr. Shary Key [facility]   Recombinant Zoster (Shingles) Vaccine: What You Need to Know 1. Why get vaccinated? Recombinant zoster (shingles) vaccine can prevent shingles. Shingles (also called herpes zoster, or just zoster) is a painful skin rash, usually with blisters. In addition to the rash, shingles can cause fever, headache, chills, or upset stomach. More rarely, shingles can lead to pneumonia, hearing problems, blindness, brain inflammation (encephalitis), or death. The most common complication of shingles is long-term nerve pain called postherpetic neuralgia (PHN). PHN occurs in the areas where the shingles rash was, even after the rash clears up. It can last for months or years after the rash goes away. The pain from PHN can be severe and debilitating. About 10 to 18% of people who get shingles will experience PHN. The risk of PHN increases with age. An older adult with shingles is more likely to develop PHN and have longer lasting and more severe pain than a younger person with shingles. Shingles is caused by the varicella zoster  virus, the same virus that causes chickenpox. After you have chickenpox, the virus stays in your body and can cause shingles later in life. Shingles cannot be passed from one person to another, but the virus that causes shingles can spread and cause chickenpox in someone who had never had chickenpox or received chickenpox vaccine. 2. Recombinant shingles vaccine Recombinant shingles vaccine provides strong protection against shingles. By preventing shingles, recombinant shingles vaccine also protects against PHN. Recombinant shingles vaccine is the preferred vaccine for the prevention of shingles. However, a different vaccine, live shingles vaccine, may be used in some circumstances. The recombinant shingles vaccine is recommended for adults 50 years and older without serious immune problems. It is given as a two-dose series. This vaccine is also recommended for people who have already gotten another type of shingles vaccine, the live shingles vaccine. There is no live virus in this vaccine. Shingles vaccine may be given at the same time as other vaccines. 3. Talk with your health care provider Tell your vaccine provider if the person getting the vaccine:  Has had an allergic reaction after a previous dose of recombinant shingles vaccine, or has any severe, life-threatening allergies.  Is pregnant or breastfeeding.  Is currently experiencing an episode of shingles. In some cases, your health care provider may decide to postpone shingles vaccination to a future visit. People with minor illnesses, such as a cold, may be vaccinated. People who are moderately or severely ill should usually wait until  they recover before getting recombinant shingles vaccine. Your health care provider can give you more information. 4. Risks of a vaccine reaction  A sore arm with mild or moderate pain is very common after recombinant shingles vaccine, affecting about 80% of vaccinated people. Redness and swelling can  also happen at the site of the injection.  Tiredness, muscle pain, headache, shivering, fever, stomach pain, and nausea happen after vaccination in more than half of people who receive recombinant shingles vaccine. In clinical trials, about 1 out of 6 people who got recombinant zoster vaccine experienced side effects that prevented them from doing regular activities. Symptoms usually went away on their own in 2 to 3 days. You should still get the second dose of recombinant zoster vaccine even if you had one of these reactions after the first dose. People sometimes faint after medical procedures, including vaccination. Tell your provider if you feel dizzy or have vision changes or ringing in the ears. As with any medicine, there is a very remote chance of a vaccine causing a severe allergic reaction, other serious injury, or death. 5. What if there is a serious problem? An allergic reaction could occur after the vaccinated person leaves the clinic. If you see signs of a severe allergic reaction (hives, swelling of the face and throat, difficulty breathing, a fast heartbeat, dizziness, or weakness), call 9-1-1 and get the person to the nearest hospital. For other signs that concern you, call your health care provider. Adverse reactions should be reported to the Vaccine Adverse Event Reporting System (VAERS). Your health care provider will usually file this report, or you can do it yourself. Visit the VAERS website at www.vaers.SamedayNews.es or call (361)450-2842. VAERS is only for reporting reactions, and VAERS staff do not give medical advice. 6. How can I learn more?  Ask your health care provider.  Call your local or state health department.  Contact the Centers for Disease Control and Prevention (CDC): ? Call 585-292-4935 (1-800-CDC-INFO) or ? Visit CDC's website at http://hunter.com/ Vaccine Information Statement Recombinant Zoster Vaccine (11/05/2017) This information is not intended to  replace advice given to you by your health care provider. Make sure you discuss any questions you have with your health care provider. Document Revised: 08/27/2019 Document Reviewed: 08/27/2019 Elsevier Patient Education  [place].

## 2020-06-14 LAB — LIPID PANEL
Chol/HDL Ratio: 2.7 ratio (ref 0.0–5.0)
Cholesterol, Total: 143 mg/dL (ref 100–199)
HDL: 53 mg/dL (ref >39)
LDL Chol Calc (NIH): 69 mg/dL (ref 0–99)
Triglycerides: 120 mg/dL (ref 0–149)
VLDL Cholesterol Cal: 21 mg/dL (ref 5–40)

## 2020-06-14 LAB — TSH: TSH: 0.129 u[IU]/mL — ABNORMAL LOW (ref 0.450–4.500)

## 2020-06-15 ENCOUNTER — Telehealth: Payer: Self-pay

## 2020-06-15 LAB — NOVEL CORONAVIRUS, NAA: SARS-CoV-2, NAA: NOT DETECTED

## 2020-06-15 LAB — SARS-COV-2, NAA 2 DAY TAT

## 2020-06-15 NOTE — Telephone Encounter (Signed)
Patient LVM on nurse line requesting recent lab results. Please advise.

## 2020-06-16 NOTE — Telephone Encounter (Signed)
Patient returns call to nurse line to follow up on previous message. Patient has concerns with how he should adjust medications based off of TSH results.   Please advise.   Talbot Grumbling, RN

## 2020-06-29 ENCOUNTER — Other Ambulatory Visit (HOSPITAL_COMMUNITY): Payer: Self-pay

## 2020-07-12 ENCOUNTER — Ambulatory Visit: Payer: BC Managed Care – PPO | Admitting: Family Medicine

## 2020-07-26 ENCOUNTER — Ambulatory Visit: Payer: BC Managed Care – PPO | Admitting: Nurse Practitioner

## 2020-07-31 ENCOUNTER — Other Ambulatory Visit: Payer: Self-pay | Admitting: Family Medicine

## 2020-07-31 DIAGNOSIS — I10 Essential (primary) hypertension: Secondary | ICD-10-CM

## 2020-07-31 DIAGNOSIS — E890 Postprocedural hypothyroidism: Secondary | ICD-10-CM

## 2020-10-03 DIAGNOSIS — Z0000 Encounter for general adult medical examination without abnormal findings: Secondary | ICD-10-CM | POA: Diagnosis not present

## 2020-10-03 DIAGNOSIS — E7800 Pure hypercholesterolemia, unspecified: Secondary | ICD-10-CM | POA: Diagnosis not present

## 2020-10-03 DIAGNOSIS — Z23 Encounter for immunization: Secondary | ICD-10-CM | POA: Diagnosis not present

## 2020-10-03 DIAGNOSIS — E890 Postprocedural hypothyroidism: Secondary | ICD-10-CM | POA: Diagnosis not present

## 2020-10-03 DIAGNOSIS — I10 Essential (primary) hypertension: Secondary | ICD-10-CM | POA: Diagnosis not present

## 2020-10-03 DIAGNOSIS — E119 Type 2 diabetes mellitus without complications: Secondary | ICD-10-CM | POA: Diagnosis not present

## 2020-10-30 LAB — COLOGUARD

## 2021-02-12 ENCOUNTER — Ambulatory Visit (INDEPENDENT_AMBULATORY_CARE_PROVIDER_SITE_OTHER): Payer: BC Managed Care – PPO | Admitting: Medical

## 2021-02-12 ENCOUNTER — Other Ambulatory Visit: Payer: Self-pay

## 2021-02-12 VITALS — BP 124/70 | HR 88 | Ht 66.5 in | Wt 276.6 lb

## 2021-02-12 DIAGNOSIS — E1169 Type 2 diabetes mellitus with other specified complication: Secondary | ICD-10-CM | POA: Diagnosis not present

## 2021-02-12 DIAGNOSIS — E039 Hypothyroidism, unspecified: Secondary | ICD-10-CM | POA: Diagnosis not present

## 2021-02-12 DIAGNOSIS — R29898 Other symptoms and signs involving the musculoskeletal system: Secondary | ICD-10-CM

## 2021-02-12 DIAGNOSIS — Z23 Encounter for immunization: Secondary | ICD-10-CM

## 2021-02-12 DIAGNOSIS — R251 Tremor, unspecified: Secondary | ICD-10-CM

## 2021-02-12 DIAGNOSIS — I10 Essential (primary) hypertension: Secondary | ICD-10-CM | POA: Diagnosis not present

## 2021-02-12 DIAGNOSIS — E785 Hyperlipidemia, unspecified: Secondary | ICD-10-CM

## 2021-02-12 DIAGNOSIS — R252 Cramp and spasm: Secondary | ICD-10-CM | POA: Insufficient documentation

## 2021-02-12 DIAGNOSIS — E118 Type 2 diabetes mellitus with unspecified complications: Secondary | ICD-10-CM | POA: Diagnosis not present

## 2021-02-12 LAB — POCT GLYCOSYLATED HEMOGLOBIN (HGB A1C): Hemoglobin A1C: 6 % — AB (ref 4.0–5.6)

## 2021-02-12 MED ORDER — METFORMIN HCL ER 500 MG PO TB24: 500.0000 mg | ORAL_TABLET | Freq: Every day | ORAL | 1 refills | Status: DC

## 2021-02-12 NOTE — Progress Notes (Signed)
Subjective:  Richard Duarte is a 61 y.o. male who presents for Chief Complaint  Patient presents with   new pt    New pt, get established. Bp was higher this morning. A1c last done in september     Here to establish care.  [facility]     Medical history includes diabetes type 2 diagnosed 09/2020, hypothyroidism, hypertension, hyperlipidemia, obesity.    He takes a lot of vitamins as well.  Lately the only major concern is hands with weakness, cramps, tremor.  No prior neurology consult.  Has had symptoms for months at least.  No neck pain, no upper arm pain or weakness.    Hypothyroidism - compliant with Euthyrox.  History of thyroid cancer.  Diabetes - started on metformin 09/2020.  Has done cologard twice but for some reason test was rejected twice.    No other aggravating or relieving factors.    No other c/o.  Past Medical History:  Diagnosis Date   Alcohol abuse, daily use    Anxiety    Cancer ([place])    Thyroid   Depression    Thyroid disease    Current Outpatient Medications on File Prior to Visit  Medication Sig Dispense Refill   Acetylcysteine (N-ACETYL-L-CYSTEINE) 600 MG CAPS Take by mouth.     amLODipine (NORVASC) 10 MG tablet Take 1 tablet by mouth once daily 90 tablet 0   APPLE CIDER VINEGAR PO Take 450 mg by mouth.     Ascorbic Acid (VITAMIN C) 1000 MG tablet Take 1,000 mg by mouth daily.     atorvastatin (LIPITOR) 40 MG tablet Take 1 tablet (40 mg total) by mouth daily. 90 tablet 3   Bacillus Coagulans-Inulin (PROBIOTIC FORMULA PO) Take by mouth.     Cholecalciferol (VITAMIN D) 50 MCG (2000 UT) CAPS Take by mouth.     Chromium Picolinate 1000 MCG TABS Take by mouth.     Cinnamon 500 MG TABS Take 1,000 mg by mouth.     Coenzyme Q10 100 MG TABS Take 200 mg by mouth.     Cyanocobalamin (VITAMIN B 12) 500 MCG TABS Take 1,000 mcg by mouth.     Digestive Enzymes (BETAINE HCL) 650-130 MG CAPS Take 650 mg by mouth.      Echinacea-Goldenseal (ECHINACEA COMB/GOLDEN SEAL PO) Take 900 mg by mouth.     EUTHYROX 200 MCG tablet TAKE 1 TABLET BY MOUTH ONCE DAILY BEFORE BREAKFAST 90 tablet 0   Garlic 7353 MG CAPS Take by mouth.     Ginger, Zingiber officinalis, (GINGER ROOT) 550 MG CAPS Take by mouth.     Ginkgo Biloba 60 MG TABS Take by mouth.     Ginseng 100 MG CAPS Take 200 mg by mouth.     HAWTHORNE BERRY PO Take 565 mg by mouth.     hydrochlorothiazide (MICROZIDE) 12.5 MG capsule Take 1 capsule by mouth once daily 90 capsule 0   Krill Oil 350 MG CAPS Take by mouth.     loratadine (CLARITIN) 10 MG tablet Take 1 tablet (10 mg total) by mouth daily. 30 tablet 11   Magnesium 250 MG TABS Take by mouth.     melatonin 5 MG TABS Take 5 mg by mouth.     Milk Thistle 1000 MG CAPS Take by mouth.     Misc Natural Products (LUNG TONIC PO) Take by mouth.     MISC NATURAL PRODUCTS PO Take 1,000 mg by mouth. Beet Root  MISC NATURAL PRODUCTS PO Take by mouth. Throat and voice herbal blend     Multiple Vitamin (MULTIVITAMIN) tablet Take 1 tablet by mouth daily.     Nutritional Supplements (LIVER DEFENSE PO) Take by mouth.     Omega-3 Fatty Acids (FISH OIL) 1200 MG CPDR Take 2,400 mg by mouth.     omeprazole (PRILOSEC) 20 MG capsule TAKE 1 TO 2 CAPSULES BY MOUTH TWICE DAILY 60 capsule 3   Potassium 99 MG TABS Take by mouth.     Selenium 200 MCG CAPS Take by mouth.     Specialty Vitamins Products (ADVANCED COLLAGEN PO) Take 1,600 mg by mouth.     ST JOHNS WORT PO Take by mouth.     SUPER B COMPLEX/C PO Take by mouth.     Turmeric 500 MG TABS Take by mouth.     Valerian Root 500 MG CAPS Take by mouth.     Zinc 50 MG CAPS Take by mouth.     Zn-Pyg Afri-Nettle-Saw Palmet (SAW PALMETTO COMPLEX PO) Take 300 mg by mouth.     Chromium Picolinate 1000 MCG TABS Take 1 tablet by mouth daily.     doxylamine, Sleep, (UNISOM) 25 MG tablet Take by mouth.     melatonin 5 MG TABS Take by mouth.     Plant Sterols and Stanols (CHOLESTOFF  PLUS) 450 MG CAPS Take by mouth.     No current facility-administered medications on file prior to visit.     The following portions of the patient's history were reviewed and updated as appropriate: allergies, current medications, past family history, past medical history, past social history, past surgical history and problem list.  ROS Otherwise as in subjective above  Objective: BP 124/70    Pulse 88    Ht 5' 6.5" (1.689 m)    Wt 276 lb 9.6 oz (125.5 kg)    BMI 43.98 kg/m   General appearance: alert, no distress, well developed, well nourished Neck: supple, no lymphadenopathy, no thyromegaly, no masses Heart: RRR, normal S1, S2, no murmurs Lungs: CTA bilaterally, no wheezes, rhonchi, or rales Pulses: 2+ radial pulses, 2+ pedal pulses, normal cap refill Ext: no edema Hands with normal pulses and cap refill Arms and hands without obvious deformity or swelling, wrist and finger range of motion normal There is physiological tremor with motion worse on the left compared to the right, not necessarily noticeable at rest but noticeable with action.  Finger-to-nose on the left side with some over pointing, right side more accurate.  Strength overall seems normal with fingers and hands.  Rest of arms normal strength and sensation    Assessment: Encounter Diagnoses  Name Primary?   Type II diabetes mellitus with complication (HCC) Yes   Hyperlipidemia associated with type 2 diabetes mellitus ([place])    Essential hypertension, benign    Hypothyroidism, unspecified type    Muscle cramping    Tremor    Need for prophylactic vaccination against Streptococcus pneumoniae (pneumococcus)    Hand weakness      Plan: Diabetes type 2-updated labs today, new diagnosis fall 2022, continue metformin XR 500mg  daily  Hyperlipidemia-continue statin, I reviewed lipid labs from June 2022 that were at goal, continue atorvastatin 40mg  daily  Hypertension-continue current medication, amlodipine 10 mg  daily, hydrochlorothiazide 12.5 mg daily  Hypothyroidism-continue current medication, updated labs today  Muscle cramping, muscle weakness, tremor, hand weakness -labs today, but likely referral to neurology  Counseled on the pneumococcal vaccine.  Vaccine information sheet given.  Pneumococcal vaccine Prevnar 20 given after consent obtained.   Chasten was seen today for new pt.  Diagnoses and all orders for this visit:  Type II diabetes mellitus with complication ([place]) -     HgB A1c -     Vitamin B12 -     Folate -     Magnesium  Hyperlipidemia associated with type 2 diabetes mellitus (HCC) -     Comprehensive metabolic panel  Essential hypertension, benign -     Comprehensive metabolic panel -     HgB F3V -     Vitamin B12 -     Folate -     Magnesium  Hypothyroidism, unspecified type -     TSH + free T4 -     T3  Muscle cramping -     Vitamin B12 -     Folate -     Magnesium  Tremor  Need for prophylactic vaccination against Streptococcus pneumoniae (pneumococcus) -     Pneumococcal conjugate vaccine 20-valent (Prevnar 20)  Hand weakness    Follow up: pending labs, 88mo

## 2021-02-13 LAB — COMPREHENSIVE METABOLIC PANEL
ALT: 51 IU/L — ABNORMAL HIGH (ref 0–44)
AST: 35 IU/L (ref 0–40)
Albumin/Globulin Ratio: 2.6 — ABNORMAL HIGH (ref 1.2–2.2)
Albumin: 5.1 g/dL — ABNORMAL HIGH (ref 3.8–4.8)
Alkaline Phosphatase: 98 IU/L (ref 44–121)
BUN/Creatinine Ratio: 18 (ref 10–24)
BUN: 18 mg/dL (ref 8–27)
Bilirubin Total: 0.7 mg/dL (ref 0.0–1.2)
CO2: 21 mmol/L (ref 20–29)
Calcium: 9.6 mg/dL (ref 8.6–10.2)
Chloride: 98 mmol/L (ref 96–106)
Creatinine, Ser: 1.01 mg/dL (ref 0.76–1.27)
Globulin, Total: 2 g/dL (ref 1.5–4.5)
Glucose: 104 mg/dL — ABNORMAL HIGH (ref 70–99)
Potassium: 4 mmol/L (ref 3.5–5.2)
Sodium: 140 mmol/L (ref 134–144)
Total Protein: 7.1 g/dL (ref 6.0–8.5)
eGFR: 84 mL/min/{1.73_m2} (ref >59)

## 2021-02-13 LAB — VITAMIN B12: Vitamin B-12: 602 pg/mL (ref 232–1245)

## 2021-02-13 LAB — MAGNESIUM: Magnesium: 2 mg/dL (ref 1.6–2.3)

## 2021-02-13 LAB — TSH+FREE T4
Free T4: 2.51 ng/dL — ABNORMAL HIGH (ref 0.82–1.77)
TSH: 0.028 u[IU]/mL — ABNORMAL LOW (ref 0.450–4.500)

## 2021-02-13 LAB — FOLATE: Folate: 9.4 ng/mL (ref >3.0)

## 2021-02-13 LAB — T3: T3, Total: 101 ng/dL (ref 71–180)

## 2021-02-15 ENCOUNTER — Other Ambulatory Visit: Payer: Self-pay | Admitting: Medical

## 2021-02-15 DIAGNOSIS — R29898 Other symptoms and signs involving the musculoskeletal system: Secondary | ICD-10-CM

## 2021-02-15 DIAGNOSIS — R252 Cramp and spasm: Secondary | ICD-10-CM

## 2021-02-15 DIAGNOSIS — R251 Tremor, unspecified: Secondary | ICD-10-CM

## 2021-02-15 MED ORDER — LEVOTHYROXINE SODIUM 175 MCG PO TABS: 175.0000 ug | ORAL_TABLET | Freq: Every day | ORAL | 0 refills | Status: DC

## 2021-02-15 NOTE — Progress Notes (Signed)
neuro

## 2021-02-16 ENCOUNTER — Encounter: Payer: Self-pay | Admitting: Neurology

## 2021-02-22 ENCOUNTER — Encounter: Payer: Self-pay | Admitting: Internal Medicine

## 2021-02-22 NOTE — Telephone Encounter (Signed)
I spoke to [facility] as your were on the phone with her. I am not sure why it was scheduled like this but I wanted to let you know before you came in as you can not have them both together. Thank you for being understanding.

## 2021-02-23 ENCOUNTER — Other Ambulatory Visit: Payer: Self-pay

## 2021-02-23 ENCOUNTER — Other Ambulatory Visit (INDEPENDENT_AMBULATORY_CARE_PROVIDER_SITE_OTHER): Payer: BC Managed Care – PPO

## 2021-02-23 DIAGNOSIS — Z23 Encounter for immunization: Secondary | ICD-10-CM
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
## 2021-03-02 ENCOUNTER — Ambulatory Visit (INDEPENDENT_AMBULATORY_CARE_PROVIDER_SITE_OTHER): Payer: BC Managed Care – PPO | Admitting: Neurology

## 2021-03-02 ENCOUNTER — Other Ambulatory Visit: Payer: Self-pay

## 2021-03-02 ENCOUNTER — Encounter: Payer: Self-pay | Admitting: Neurology

## 2021-03-02 VITALS — BP 147/92 | HR 86 | Ht 66.0 in | Wt 277.6 lb

## 2021-03-02 DIAGNOSIS — Z789 Other specified health status: Secondary | ICD-10-CM | POA: Diagnosis not present

## 2021-03-02 DIAGNOSIS — R251 Tremor, unspecified: Secondary | ICD-10-CM | POA: Diagnosis not present

## 2021-03-02 DIAGNOSIS — E890 Postprocedural hypothyroidism: Secondary | ICD-10-CM | POA: Diagnosis not present

## 2021-03-06 ENCOUNTER — Other Ambulatory Visit: Payer: BC Managed Care – PPO

## 2021-03-12 ENCOUNTER — Other Ambulatory Visit: Payer: Self-pay

## 2021-03-12 ENCOUNTER — Ambulatory Visit: Payer: BC Managed Care – PPO | Admitting: Medical

## 2021-03-12 VITALS — BP 140/90 | HR 90 | Wt 275.0 lb

## 2021-03-12 DIAGNOSIS — E785 Hyperlipidemia, unspecified: Secondary | ICD-10-CM

## 2021-03-12 DIAGNOSIS — R7989 Other specified abnormal findings of blood chemistry: Secondary | ICD-10-CM

## 2021-03-12 DIAGNOSIS — Z125 Encounter for screening for malignant neoplasm of prostate: Secondary | ICD-10-CM | POA: Diagnosis not present

## 2021-03-12 DIAGNOSIS — E118 Type 2 diabetes mellitus with unspecified complications: Secondary | ICD-10-CM | POA: Diagnosis not present

## 2021-03-12 DIAGNOSIS — E1169 Type 2 diabetes mellitus with other specified complication: Secondary | ICD-10-CM | POA: Diagnosis not present

## 2021-03-12 DIAGNOSIS — E039 Hypothyroidism, unspecified: Secondary | ICD-10-CM

## 2021-03-12 DIAGNOSIS — Z136 Encounter for screening for cardiovascular disorders: Secondary | ICD-10-CM | POA: Insufficient documentation

## 2021-03-12 DIAGNOSIS — I10 Essential (primary) hypertension: Secondary | ICD-10-CM

## 2021-03-12 DIAGNOSIS — Z6841 Body Mass Index (BMI) 40.0 and over, adult: Secondary | ICD-10-CM

## 2021-03-12 DIAGNOSIS — R251 Tremor, unspecified: Secondary | ICD-10-CM | POA: Diagnosis not present

## 2021-03-12 DIAGNOSIS — Z1211 Encounter for screening for malignant neoplasm of colon: Secondary | ICD-10-CM | POA: Insufficient documentation

## 2021-03-12 MED ORDER — OLMESARTAN-AMLODIPINE-HCTZ 40-10-12.5 MG PO TABS: 1.0000 | ORAL_TABLET | Freq: Every day | ORAL | 3 refills | Status: DC

## 2021-03-12 MED ORDER — METFORMIN HCL ER 500 MG PO TB24: 1000.0000 mg | ORAL_TABLET | Freq: Every day | ORAL | 1 refills | Status: DC

## 2021-03-12 NOTE — Progress Notes (Signed)
Subjective: ? Richard Duarte is a 61 y.o. male who presents for ?Chief Complaint  ?Patient presents with  ? follow-up  ?  Follow-up on thyroid. Bp has been running high on his monitor. Would like 90 days of metformin  ?   ?Here for follow-up. ? ?I saw him as a new patient recently to establish care.  Last visit we did some labs.  We adjusted his thyroid medication. ? ?Last visit we stopped 244mg euthyrox and changed to 1715m.  He is doing fine on this without issue. ? ?He did a Cologuard last year but there was an issue with the sample.  So it was not valid. ? ?Medical history includes diabetes type 2 diagnosed 09/2020, hypothyroidism, hypertension, hyperlipidemia, obesity.   ? ?He takes a lot of vitamins ? ?Diabetes - started on metformin 09/2020.  Blood sugars lately running in the 115 and 130 range fasting.  He is compliant with metformin once daily ? ?Hypertension-compliant with medications, home blood pressure readings are running in the 148/95 range on average for the last 1 month and 6 months per his app on his phone.  He brought his cuff today and his cuff is reading 10 points higher than our cuff. ? ?Last visit we referred him to neurology given tremor and hand weakness.  They felt his symptoms were combination of likely thyroid issues and alcohol use chronically ? ?He is drinking on average 2 vodka shots daily ? ?No other aggravating or relieving factors.   ? ?No other c/o. ? ?Past Medical History:  ?Diagnosis Date  ? Alcohol abuse, daily use   ? Anxiety   [facility]  ? Thyroid  ? Depression   ? Hyperlipidemia   ? Hypertension   ? Thyroid disease   ? ?Current Outpatient Medications on File Prior to Visit  ?Medication Sig Dispense Refill  ? Acetylcysteine (N-ACETYL-L-CYSTEINE) 600 MG CAPS Take by mouth.    ? amLODipine (NORVASC) 10 MG tablet Take 1 tablet by mouth once daily 90 tablet 0  ? APPLE CIDER VINEGAR PO Take 450 mg by mouth.    ? Ascorbic Acid (VITAMIN C) 1000 MG tablet Take 1,000 mg by  mouth daily.    ? atorvastatin (LIPITOR) 40 MG tablet Take 1 tablet (40 mg total) by mouth daily. 90 tablet 3  ? Bacillus Coagulans-Inulin (PROBIOTIC FORMULA PO) Take by mouth.    ? Cholecalciferol (VITAMIN D) 50 MCG (2000 UT) CAPS Take by mouth.    ? Chromium Picolinate 1000 MCG TABS Take by mouth.    ? Chromium Picolinate 1000 MCG TABS Take 1 tablet by mouth daily.    ? Cinnamon 500 MG TABS Take 1,000 mg by mouth.    ? Coenzyme Q10 100 MG TABS Take 200 mg by mouth.    ? Cyanocobalamin (VITAMIN B 12) 500 MCG TABS Take 1,000 mcg by mouth.    ? Digestive Enzymes (BETAINE HCL) 650-130 MG CAPS Take 650 mg by mouth.    ? doxylamine, Sleep, (UNISOM) 25 MG tablet Take by mouth.    ? Echinacea-Goldenseal (ECHINACEA COMB/GOLDEN SEAL PO) Take 900 mg by mouth.    ? EUTHYROX 200 MCG tablet TAKE 1 TABLET BY MOUTH ONCE DAILY BEFORE BREAKFAST 90 tablet 0  ? Garlic 107793G CAPS Take by mouth.    ? Ginger, Zingiber officinalis, (GINGER ROOT) 550 MG CAPS Take by mouth.    ? Ginkgo Biloba 60 MG TABS Take by mouth.    ? Ginseng 100 MG  CAPS Take 200 mg by mouth.    ? HAWTHORNE BERRY PO Take 565 mg by mouth.    ? hydrochlorothiazide (MICROZIDE) 12.5 MG capsule Take 1 capsule by mouth once daily 90 capsule 0  ? Krill Oil 350 MG CAPS Take by mouth.    ? levothyroxine (EUTHYROX) 175 MCG tablet Take 1 tablet (175 mcg total) by mouth daily before breakfast. 30 tablet 0  ? loratadine (CLARITIN) 10 MG tablet Take 1 tablet (10 mg total) by mouth daily. 30 tablet 11  ? Magnesium 250 MG TABS Take by mouth.    ? melatonin 5 MG TABS Take 5 mg by mouth.    ? Milk Thistle 1000 MG CAPS Take by mouth.    ? Misc Natural Products (LUNG TONIC PO) Take by mouth.    ? MISC NATURAL PRODUCTS PO Take 1,000 mg by mouth. Beet Root    ? MISC NATURAL PRODUCTS PO Take by mouth. Throat and voice herbal blend    ? Multiple Vitamin (MULTIVITAMIN) tablet Take 1 tablet by mouth daily.    ? Nutritional Supplements (LIVER DEFENSE PO) Take by mouth.    ? Omega-3 Fatty  Acids (FISH OIL) 1200 MG CPDR Take 2,400 mg by mouth.    ? omeprazole (PRILOSEC) 20 MG capsule TAKE 1 TO 2 CAPSULES BY MOUTH TWICE DAILY 60 capsule 3  ? Plant Sterols and Stanols (CHOLESTOFF PLUS) 450 MG CAPS Take by mouth.    ? Potassium 99 MG TABS Take by mouth.    ? Selenium 200 MCG CAPS Take by mouth.    ? Specialty Vitamins Products (ADVANCED COLLAGEN PO) Take 1,600 mg by mouth.    ? ST JOHNS WORT PO Take by mouth.    ? SUPER B COMPLEX/C PO Take by mouth.    ? Turmeric 500 MG TABS Take by mouth.    ? Valerian Root 500 MG CAPS Take by mouth.    ? Zinc 50 MG CAPS Take by mouth.    ? Zn-Pyg Afri-Nettle-Saw Palmet (SAW PALMETTO COMPLEX PO) Take 300 mg by mouth.    ? ?No current facility-administered medications on file prior to visit.  ? ? ? ?The following portions of the patient's history were reviewed and updated as appropriate: allergies, current medications, past family history, past medical history, past social history, past surgical history and problem list. ? ?ROS ?Otherwise as in subjective above ? ?Objective: ?BP 140/90   Pulse 90   Wt 275 lb (124.7 kg)   BMI 44.39 kg/m?  ? ? ?BP Readings from Last 3 Encounters:  ?03/12/21 140/90  ?03/02/21 (!) 147/92  ?02/12/21 124/70  ? ?General appearance: alert, no distress, well developed, well nourished ?Neck: supple, no lymphadenopathy, no thyromegaly, no masses ?Heart: RRR, normal S1, S2, no murmurs ?Lungs: CTA bilaterally, no wheezes, rhonchi, or rales ?Pulses: 2+ radial pulses, 2+ pedal pulses, normal cap refill ?Ext: no edema ? ?EKG indication hypertension, diabetes, rate 86 bpm, PR 100 ms, QRS 86 ms, QTc 454 ms, 1 degree axis, sinus rhythm, nonspecific ST abnormality mostly in lead III. ? ?Assessment: ?Encounter Diagnoses  ?Name Primary?  ? Hypothyroidism, unspecified type Yes  ? Type II diabetes mellitus with complication (HCC)   ? Screening for prostate cancer   ? Tremor   ? Hyperlipidemia associated with type 2 diabetes mellitus ([place])   ? Essential  hypertension, benign   ? BMI 40.0-44.9, adult ([place])   ? Screen for colon cancer   ? Screening for ischemic heart disease   ?  Elevated LFTs   ? ? ? ? ?Plan: ?Hypothyroidism-updated labs today since we changed Euthyrox dose last visit a month ago. ? ?Diabetes-continue glucose monitoring.  Increase to metformin XR 500 mg 2 daily ? ?Hypertension-stop current dose of hydrochlorothiazide and amlodipine and change to Tribenzor as below for better blood pressure control and renal protection.  Continue home blood pressure monitoring. ? ?Hyperlipidemia-continue atorvastatin Lipitor daily ? ?Screen for colon cancer-refer for Cologuard ? ?Screening for prostate cancer-baseline PSA today ? ?Elevated liver test on labs likely due to alcohol use.  He will work on cutting back alcohol and plan to recheck labs in a few months.  We discussed possible differential.  No prior ultrasounds reviewed.  He did have negative HIV and hepatitis C testing in 2021. ? ?Referral to nutrition counseling today ? ?Sufian was seen today for follow-up. ? ?Diagnoses and all orders for this visit: ? ?Hypothyroidism, unspecified type ?-     TSH + free T4 ? ?Type II diabetes mellitus with complication (HCC) ?-     EKG 12-Lead ?-     Amb ref to Medical Nutrition Therapy-MNT ? ?Screening for prostate cancer ?-     PSA ? ?Tremor ?-     EKG 12-Lead ? ?Hyperlipidemia associated with type 2 diabetes mellitus ([place]) ?-     EKG 12-Lead ? ?Essential hypertension, benign ?-     EKG 12-Lead ? ?BMI 40.0-44.9, adult ([place]) ?-     Amb ref to Medical Nutrition Therapy-MNT ? ?Screen for colon cancer ?-     Cologuard ? ?Screening for ischemic heart disease ?-     EKG 12-Lead ? ?Elevated LFTs ? ?Other orders ?-     Olmesartan-amLODIPine-HCTZ 40-10-12.5 MG TABS; Take 1 tablet by mouth daily. ?-     metFORMIN (GLUCOPHAGE-XR) 500 MG 24 hr tablet; Take 2 tablets (1,000 mg total) by mouth daily. ? ? ? ? ?Follow up: pending labs, 79mo? ? ?

## 2021-03-13 LAB — TSH+FREE T4
Free T4: 2.02 ng/dL — ABNORMAL HIGH (ref 0.82–1.77)
TSH: 0.671 u[IU]/mL (ref 0.450–4.500)

## 2021-03-13 LAB — PSA: Prostate Specific Ag, Serum: 1.6 ng/mL (ref 0.0–4.0)

## 2021-03-14 ENCOUNTER — Other Ambulatory Visit: Payer: Self-pay | Admitting: Medical

## 2021-03-14 MED ORDER — LEVOTHYROXINE SODIUM 150 MCG PO TABS: 150.0000 ug | ORAL_TABLET | Freq: Every day | ORAL | 1 refills | Status: DC

## 2021-03-15 ENCOUNTER — Encounter: Payer: Self-pay | Admitting: Medical

## 2021-03-20 DIAGNOSIS — M25562 Pain in left knee: Secondary | ICD-10-CM | POA: Diagnosis not present

## 2021-03-28 DIAGNOSIS — Z1211 Encounter for screening for malignant neoplasm of colon: Secondary | ICD-10-CM | POA: Diagnosis not present

## 2021-04-04 ENCOUNTER — Other Ambulatory Visit: Payer: Self-pay | Admitting: Medical

## 2021-04-04 MED ORDER — LEVOTHYROXINE SODIUM 175 MCG PO TABS: 175.0000 ug | ORAL_TABLET | Freq: Every day | ORAL | 1 refills | Status: AC

## 2021-04-07 LAB — COLOGUARD: COLOGUARD: NEGATIVE

## 2021-04-16 ENCOUNTER — Other Ambulatory Visit: Payer: BC Managed Care – PPO

## 2021-04-27 ENCOUNTER — Encounter: Payer: Self-pay | Admitting: Internal Medicine

## 2021-05-14 ENCOUNTER — Ambulatory Visit: Payer: BC Managed Care – PPO | Admitting: Internal Medicine

## 2021-06-08 DIAGNOSIS — E890 Postprocedural hypothyroidism: Secondary | ICD-10-CM | POA: Diagnosis not present

## 2021-06-08 DIAGNOSIS — E119 Type 2 diabetes mellitus without complications: Secondary | ICD-10-CM | POA: Diagnosis not present

## 2021-06-08 DIAGNOSIS — Z85850 Personal history of malignant neoplasm of thyroid: Secondary | ICD-10-CM | POA: Diagnosis not present

## 2021-07-13 ENCOUNTER — Encounter (HOSPITAL_COMMUNITY): Payer: Self-pay | Admitting: Emergency Medicine

## 2021-07-13 ENCOUNTER — Ambulatory Visit (HOSPITAL_COMMUNITY)
Admission: EM | Admit: 2021-07-13 | Discharge: 2021-07-13 | Disposition: A | Discharge status: Home / Self Care | Payer: BC Managed Care – PPO | Attending: Family Medicine | Admitting: Family Medicine

## 2021-07-13 ENCOUNTER — Ambulatory Visit (INDEPENDENT_AMBULATORY_CARE_PROVIDER_SITE_OTHER): Payer: BC Managed Care – PPO

## 2021-07-13 DIAGNOSIS — Z8709 Personal history of other diseases of the respiratory system: Secondary | ICD-10-CM | POA: Diagnosis not present

## 2021-07-13 DIAGNOSIS — Z23 Encounter for immunization: Secondary | ICD-10-CM

## 2021-07-13 DIAGNOSIS — M25561 Pain in right knee: Secondary | ICD-10-CM | POA: Diagnosis not present

## 2021-07-13 DIAGNOSIS — S022XXA Fracture of nasal bones, initial encounter for closed fracture: Secondary | ICD-10-CM | POA: Diagnosis not present

## 2021-07-13 DIAGNOSIS — S80211A Abrasion, right knee, initial encounter: Secondary | ICD-10-CM | POA: Diagnosis not present

## 2021-07-13 DIAGNOSIS — T07XXXA Unspecified multiple injuries, initial encounter: Secondary | ICD-10-CM

## 2021-07-13 DIAGNOSIS — M7989 Other specified soft tissue disorders: Secondary | ICD-10-CM | POA: Diagnosis not present

## 2021-07-13 MED ORDER — TETANUS-DIPHTH-ACELL PERTUSSIS 5-2.5-18.5 LF-MCG/0.5 IM SUSY
PREFILLED_SYRINGE | INTRAMUSCULAR | Status: AC
  Filled 2021-07-13: qty 0.5

## 2021-07-13 MED ORDER — TETANUS-DIPHTH-ACELL PERTUSSIS 5-2.5-18.5 LF-MCG/0.5 IM SUSY
0.5000 mL | PREFILLED_SYRINGE | Freq: Once | INTRAMUSCULAR | Status: AC
  Administered 2021-07-13: 0.5 mL via INTRAMUSCULAR

## 2021-07-13 NOTE — ED Triage Notes (Signed)
Pt presents to UC for a fall. States he fell around 8:30 pm lastnight and hit right knee on the table and hit his nose on the floor. States his nose was bleeding and bleeding stopped after about 5 minutes. Also endorses left calf getting cut by the glass table.

## 2021-07-13 NOTE — ED Provider Notes (Signed)
[place]    CSN: [id number] Arrival date & time: 07/13/21  0801      History   Chief Complaint Chief Complaint  Patient presents with   Fall    HPI Richard Duarte is a 61 y.o. male.    Fall   Here for pain in his right knee, and his nose, and left heel.  Yesterday when he was coming down the hall with a load of laundry in his arms, he hit coffee table that he had set out in the hall.  He struck it with his right knee and then fell forward and hit his upper bridge of his nose.  No loss of consciousness.  The table did break and he realized later that he probably had some cuts from the glass also.  The nose is swollen today and painful.  He did have some nosebleed for about 5 minutes after this occurred and then it stopped.  He has some pain around his right anterior and medial knee and it is throbs overnight.  Also he has a cut on the back of his left calf and onto his heel.  It throbs a lot last night but it is feeling better this morning.  Ibuprofen has helped his pain that he has been having.  He is not on any blood thinners.  He does take medication for diabetes  Past Medical History:  Diagnosis Date   Alcohol abuse, daily use    Anxiety    Cancer ([place])    Thyroid   Depression    Hyperlipidemia    Hypertension    Thyroid disease     Patient Active Problem List   Diagnosis Date Noted   Screening for prostate cancer 03/12/2021   Screen for colon cancer 03/12/2021   Screening for ischemic heart disease 03/12/2021   Type II diabetes mellitus with complication ([place]) 27/06/2374   Hyperlipidemia associated with type 2 diabetes mellitus ([place]) 02/12/2021   Essential hypertension, benign 02/12/2021   Hypothyroidism 02/12/2021   Muscle cramping 02/12/2021   Tremor 02/12/2021   Hand weakness 02/12/2021   Grief reaction 11/02/2019   BMI 40.0-44.9, adult ([place]) 11/02/2019   Elevated glucose level 10/27/2019   Snoring 02/04/2017   Postoperative  hypothyroidism 11/18/2016   Obesity due to excess calories without serious comorbidity 11/18/2016    Past Surgical History:  Procedure Laterality Date   THYROIDECTOMY         Home Medications    Prior to Admission medications   Medication Sig Start Date End Date Taking? Authorizing Provider  Acetylcysteine (N-ACETYL-L-CYSTEINE) 600 MG CAPS Take by mouth.    [provider]  amLODipine (NORVASC) 10 MG tablet Take 1 tablet by mouth once daily 07/31/20   Shary Key, DO  APPLE CIDER VINEGAR PO Take 450 mg by mouth.    [provider]  Ascorbic Acid (VITAMIN C) 1000 MG tablet Take 1,000 mg by mouth daily.    [provider]  atorvastatin (LIPITOR) 40 MG tablet Take 1 tablet (40 mg total) by mouth daily. 10/26/19   Autry-Lott, Naaman Plummer, DO  Bacillus Coagulans-Inulin (PROBIOTIC FORMULA PO) Take by mouth.    [provider]  Cholecalciferol (VITAMIN D) 50 MCG (2000 UT) CAPS Take by mouth.    [provider]  Chromium Picolinate 1000 MCG TABS Take by mouth.    [provider]  Chromium Picolinate 1000 MCG TABS Take 1 tablet by mouth daily.    [provider]  Cinnamon 500  MG TABS Take 1,000 mg by mouth.    [provider]  Coenzyme Q10 100 MG TABS Take 200 mg by mouth.    [provider]  Cyanocobalamin (VITAMIN B 12) 500 MCG TABS Take 1,000 mcg by mouth.    [provider]  Digestive Enzymes (BETAINE HCL) 650-130 MG CAPS Take 650 mg by mouth.    [provider]  doxylamine, Sleep, (UNISOM) 25 MG tablet Take by mouth.    [provider]  Echinacea-Goldenseal (ECHINACEA COMB/GOLDEN SEAL PO) Take 900 mg by mouth.    [provider]  Garlic 6979 MG CAPS Take by mouth.    [provider]  Ginger, Zingiber officinalis, (GINGER ROOT) 550 MG CAPS Take by mouth.    [provider]  Ginkgo Biloba 60 MG TABS Take by mouth.    [provider]  Ginseng 100  MG CAPS Take 200 mg by mouth.    [provider]  HAWTHORNE BERRY PO Take 565 mg by mouth.    [provider]  hydrochlorothiazide (MICROZIDE) 12.5 MG capsule Take 1 capsule by mouth once daily 07/31/20   Shary Key, DO  Krill Oil 350 MG CAPS Take by mouth.    [provider]  levothyroxine (EUTHYROX) 175 MCG tablet Take 1 tablet (175 mcg total) by mouth daily before breakfast. 04/04/21   Tysinger, Camelia Eng, PA-C  loratadine (CLARITIN) 10 MG tablet Take 1 tablet (10 mg total) by mouth daily. 04/30/19   Nuala Alpha, MD  Magnesium 250 MG TABS Take by mouth.    [provider]  melatonin 5 MG TABS Take 5 mg by mouth.    [provider]  metFORMIN (GLUCOPHAGE-XR) 500 MG 24 hr tablet Take 2 tablets (1,000 mg total) by mouth daily. 03/12/21   Tysinger, Camelia Eng, PA-C  Milk Thistle 1000 MG CAPS Take by mouth.    [provider]  Misc Natural Products (LUNG TONIC PO) Take by mouth.    [provider]  MISC NATURAL PRODUCTS PO Take 1,000 mg by mouth. Beet Root    [provider]  MISC NATURAL PRODUCTS PO Take by mouth. Throat and voice herbal blend    [provider]  Multiple Vitamin (MULTIVITAMIN) tablet Take 1 tablet by mouth daily.    [provider]  Nutritional Supplements (LIVER DEFENSE PO) Take by mouth.    [provider]  Olmesartan-amLODIPine-HCTZ 40-10-12.5 MG TABS Take 1 tablet by mouth daily. 03/12/21   Tysinger, Camelia Eng, PA-C  Omega-3 Fatty Acids (FISH OIL) 1200 MG CPDR Take 2,400 mg by mouth.    [provider]  omeprazole (PRILOSEC) 20 MG capsule TAKE 1 TO 2 CAPSULES BY MOUTH TWICE DAILY 07/16/17   Armbruster, Carlota Raspberry, MD  Plant Sterols and Stanols (CHOLESTOFF PLUS) 450 MG CAPS Take by mouth.    [provider]  Potassium 99 MG TABS Take by mouth.    [provider]  Selenium 200 MCG CAPS Take by mouth.    [provider]  Specialty Vitamins Products  (ADVANCED COLLAGEN PO) Take 1,600 mg by mouth.    [provider]  ST JOHNS WORT PO Take by mouth.    [provider]  SUPER B COMPLEX/C PO Take by mouth.    [provider]  Turmeric 500 MG TABS Take by mouth.    [provider]  Valerian Root 500 MG CAPS Take by mouth.    [provider]  Zinc 50  MG CAPS Take by mouth.    [provider]  Zn-Pyg Afri-Nettle-Saw Palmet (SAW PALMETTO COMPLEX PO) Take 300 mg by mouth.    [provider]    Family History Family History  Problem Relation Age of Onset   Dupuytren's contracture Mother    Alzheimer's disease Mother    Hypertension Mother    Dementia Mother    Diabetes Father    Hypertension Father    Thyroid cancer Sister    Dupuytren's contracture Sister    Diabetes Sister    Dementia Maternal Aunt    Colon cancer Neg Hx    Esophageal cancer Neg Hx    Pancreatic cancer Neg Hx    Stomach cancer Neg Hx     Social History Social History   Tobacco Use   Smoking status: Never   Smokeless tobacco: Never  Vaping Use   Vaping Use: Never used  Substance Use Topics   Alcohol use: Yes    Comment: 2 nightly   Drug use: No     Allergies   Isovue [iopamidol], Amoxicillin, Penicillins, and Keflex [cephalexin]   Review of Systems Review of Systems   Physical Exam Triage Vital Signs ED Triage Vitals  Enc Vitals Group     BP 07/13/21 0824 133/84     Pulse Rate 07/13/21 0824 81     Resp 07/13/21 0824 18     Temp 07/13/21 0824 98.1 F (36.7 C)     Temp Source 07/13/21 0824 Oral     SpO2 07/13/21 0824 96 %     Weight --      Height --      Head Circumference --      Peak Flow --      Pain Score 07/13/21 0820 4     Pain Loc --      Pain Edu? --      Excl. in [place]? --    No data found.  Updated Vital Signs BP 133/84 (BP Location: Left Arm)   Pulse 81   Temp 98.1 F (36.7 C) (Oral)   Resp 18   SpO2 96%   Visual Acuity Right Eye Distance:   Left Eye  Distance:   Bilateral Distance:    Right Eye Near:   Left Eye Near:    Bilateral Near:     Physical Exam Vitals reviewed.  Constitutional:      General: He is not in acute distress.    Appearance: He is not toxic-appearing.  HENT:     Nose:     Comments: There is some mild swelling of the nose on the upper part.  There is an abrasion near the glabella.  It is not bleeding.      Mouth/Throat:     Mouth: Mucous membranes are moist.     Pharynx: No oropharyngeal exudate or posterior oropharyngeal erythema.  Eyes:     Extraocular Movements: Extraocular movements intact.     Pupils: Pupils are equal, round, and reactive to light.  Cardiovascular:     Rate and Rhythm: Normal rate and regular rhythm.     Heart sounds: No murmur heard. Pulmonary:     Effort: Pulmonary effort is normal.     Breath sounds: Normal breath sounds.  Musculoskeletal:     Cervical back: Neck supple.     Comments: On his right anterior knee there are several shallow abrasions some of them are linear.  One is about 2 to 3 mm in width but is  very shallow.  There is some mild swelling over the anterior knee.  Is also tender there; no erythema  Skin:    Capillary Refill: Capillary refill takes less than 2 seconds.     Comments: There is a linear laceration that extends from over his gastrocnemius down onto the skin over his left Achilles tendon.  It is very shallow.  I cannot find any foreign body or glass in it.  Some mild swelling of the medial ankle.  Neurological:     General: No focal deficit present.     Mental Status: He is alert and oriented to person, place, and time.  Psychiatric:        Behavior: Behavior normal.      UC Treatments / Results  Labs (all labs ordered are listed, but only abnormal results are displayed) Labs Reviewed - No data to display  EKG   Radiology DG Nasal Bones  Result Date: 07/13/2021 CLINICAL DATA:  Nasal pain and swelling after falling. EXAM: NASAL BONES - 3+ VIEW  COMPARISON:  None Available. FINDINGS: Minimally displaced acute nasal bone fracture, best seen on the left lateral view. The nasal process of the maxilla appears intact. No other fractures are identified. The visualized paranasal sinuses are clear without air-fluid levels. No evidence of foreign body. IMPRESSION: Minimally displaced acute nasal bone fracture. Electronically Signed   By: Richardean Sale M.D.   On: 07/13/2021 09:30   DG Knee AP/LAT W/[place] Right  Result Date: 07/13/2021 CLINICAL DATA:  Right knee pain EXAM: RIGHT KNEE 3 VIEWS COMPARISON:  Right knee radiograph dated May 05, 2014 FINDINGS: No evidence of fracture, dislocation, or joint effusion. No evidence of arthropathy or other focal bone abnormality. Soft tissue swelling of the anterior knee. IMPRESSION: No acute osseous abnormality. Electronically Signed   By: Yetta Glassman M.D.   On: 07/13/2021 09:26    Procedures Procedures (including critical care time)  Medications Ordered in UC Medications  Tdap (BOOSTRIX) injection 0.5 mL (has no administration in time range)    Initial Impression / Assessment and Plan / UC Course  I have reviewed the triage vital signs and the nursing notes.  Pertinent labs & imaging results that were available during my care of the patient were reviewed by me and considered in my medical decision making (see chart for details).    Last tetanus was in Feb 2018 X-ray of the right knee is negative for bony problem.  He does have a mildly displaced nasal fracture  He will ice and elevate anything that hurts wound care is explained. Final Clinical Impressions(s) / UC Diagnoses   Final diagnoses:  Closed fracture of nasal bone, initial encounter  Acute pain of right knee  Multiple abrasions     Discharge Instructions      The x-ray of your knee did not show any broken bones  You had a mildly displaced nasal bone fracture on those x-rays  Ice and elevate anything that is swollen or  hurting.  You can continue to take ibuprofen as needed for pain     ED Prescriptions   None    PDMP not reviewed this encounter.   Barrett Henle, MD 07/13/21 [phone]

## 2021-07-13 NOTE — Discharge Instructions (Addendum)
The x-ray of your knee did not show any broken bones  You had a mildly displaced nasal bone fracture on those x-rays  Ice and elevate anything that is swollen or hurting.  You can continue to take ibuprofen as needed for pain

## 2021-07-16 DIAGNOSIS — M25561 Pain in right knee: Secondary | ICD-10-CM | POA: Diagnosis not present

## 2021-08-02 DIAGNOSIS — H40013 Open angle with borderline findings, low risk, bilateral: Secondary | ICD-10-CM | POA: Diagnosis not present

## 2021-08-02 DIAGNOSIS — Z7984 Long term (current) use of oral hypoglycemic drugs: Secondary | ICD-10-CM | POA: Diagnosis not present

## 2021-08-02 DIAGNOSIS — E119 Type 2 diabetes mellitus without complications: Secondary | ICD-10-CM | POA: Diagnosis not present

## 2021-08-02 DIAGNOSIS — H524 Presbyopia: Secondary | ICD-10-CM | POA: Diagnosis not present

## 2021-08-02 DIAGNOSIS — H2513 Age-related nuclear cataract, bilateral: Secondary | ICD-10-CM | POA: Diagnosis not present

## 2021-08-02 DIAGNOSIS — H52203 Unspecified astigmatism, bilateral: Secondary | ICD-10-CM | POA: Diagnosis not present

## 2021-08-02 DIAGNOSIS — H5213 Myopia, bilateral: Secondary | ICD-10-CM | POA: Diagnosis not present

## 2021-08-02 LAB — HM DIABETES EYE EXAM

## 2021-08-03 ENCOUNTER — Other Ambulatory Visit: Payer: Self-pay

## 2021-08-03 ENCOUNTER — Encounter: Payer: Self-pay | Admitting: Medical

## 2021-08-03 DIAGNOSIS — H40003 Preglaucoma, unspecified, bilateral: Secondary | ICD-10-CM | POA: Insufficient documentation

## 2021-08-03 HISTORY — DX: Preglaucoma, unspecified, bilateral: H40.003

## 2021-08-03 MED ORDER — METFORMIN HCL ER 500 MG PO TB24: 1000.0000 mg | ORAL_TABLET | Freq: Every day | ORAL | 1 refills | Status: AC

## 2021-08-08 ENCOUNTER — Encounter: Payer: Self-pay | Admitting: Podiatry

## 2021-08-08 ENCOUNTER — Ambulatory Visit (INDEPENDENT_AMBULATORY_CARE_PROVIDER_SITE_OTHER): Payer: BC Managed Care – PPO | Admitting: Podiatry

## 2021-08-08 DIAGNOSIS — E118 Type 2 diabetes mellitus with unspecified complications: Secondary | ICD-10-CM | POA: Diagnosis not present

## 2021-08-08 DIAGNOSIS — M79675 Pain in left toe(s): Secondary | ICD-10-CM

## 2021-08-08 DIAGNOSIS — M79674 Pain in right toe(s): Secondary | ICD-10-CM | POA: Diagnosis not present

## 2021-08-08 DIAGNOSIS — B351 Tinea unguium: Secondary | ICD-10-CM | POA: Diagnosis not present

## 2021-08-08 NOTE — Progress Notes (Signed)
This patient presents to the office with chief complaint of long thick nails and diabetic feet.  This patient  says there  is  no pain and discomfort in their feet. He has skin injury top of right foot last night. This patient says there are long thick painful big nails   These nails are painful walking and wearing shoes.  Patient has no history of infection or drainage from both feet.  Patient is unable to  self treat his own nails . This patient presents  to the office today for treatment of the  long nails and a foot evaluation due to history of  diabetes.  General Appearance  Alert, conversant and in no acute stress.  Vascular  Dorsalis pedis and posterior tibial  pulses are palpable  bilaterally.  Capillary return is within normal limits  bilaterally. Temperature is within normal limits  bilaterally. Hair present on digits.  Neurologic  Senn-Weinstein monofilament wire test within normal limits  bilaterally. Muscle power within normal limits bilaterally.  Nails Thick disfigured discolored nails with subungual debris hallux  bilaterally. No evidence of bacterial infection or drainage bilaterally.  Orthopedic  No limitations of motion of motion feet .  No crepitus or effusions noted.  No bony pathology or digital deformities noted.  Skin  normotropic skin with no porokeratosis noted bilaterally.  No signs of infections or ulcers noted.     Onychomycosis  Diabetes with no foot complications  IE  Debride nails x 10.  A diabetic foot exam was performed and there is no evidence of any vascular or neurologic pathology.   RTC 3 months.   Gardiner Barefoot DPM

## 2021-08-09 ENCOUNTER — Telehealth: Payer: Self-pay | Admitting: Medical

## 2021-08-09 DIAGNOSIS — I10 Essential (primary) hypertension: Secondary | ICD-10-CM | POA: Diagnosis not present

## 2021-08-09 DIAGNOSIS — E119 Type 2 diabetes mellitus without complications: Secondary | ICD-10-CM | POA: Diagnosis not present

## 2021-08-09 DIAGNOSIS — E890 Postprocedural hypothyroidism: Secondary | ICD-10-CM | POA: Diagnosis not present

## 2021-08-09 DIAGNOSIS — Z1159 Encounter for screening for other viral diseases: Secondary | ICD-10-CM | POA: Diagnosis not present

## 2021-08-09 DIAGNOSIS — Z125 Encounter for screening for malignant neoplasm of prostate: Secondary | ICD-10-CM | POA: Diagnosis not present

## 2021-08-09 DIAGNOSIS — K219 Gastro-esophageal reflux disease without esophagitis: Secondary | ICD-10-CM | POA: Diagnosis not present

## 2021-08-09 DIAGNOSIS — E782 Mixed hyperlipidemia: Secondary | ICD-10-CM | POA: Diagnosis not present

## 2021-08-09 NOTE — Telephone Encounter (Signed)
[facility] records request received. Forwarded to HIM.

## 2021-09-03 ENCOUNTER — Ambulatory Visit: Payer: BC Managed Care – PPO | Admitting: Internal Medicine

## 2021-09-12 ENCOUNTER — Encounter: Payer: BC Managed Care – PPO | Admitting: Medical

## 2021-10-06 DIAGNOSIS — Z23 Encounter for immunization: Secondary | ICD-10-CM | POA: Diagnosis not present

## 2021-10-19 ENCOUNTER — Ambulatory Visit (HOSPITAL_COMMUNITY)
Admission: EM | Admit: 2021-10-19 | Discharge: 2021-10-19 | Disposition: A | Discharge status: Home / Self Care | Payer: BC Managed Care – PPO | Attending: Physician Assistant | Admitting: Physician Assistant

## 2021-10-19 ENCOUNTER — Encounter (HOSPITAL_COMMUNITY): Payer: Self-pay

## 2021-10-19 DIAGNOSIS — J40 Bronchitis, not specified as acute or chronic: Secondary | ICD-10-CM

## 2021-10-19 DIAGNOSIS — J069 Acute upper respiratory infection, unspecified: Secondary | ICD-10-CM

## 2021-10-19 MED ORDER — HYDROCODONE BIT-HOMATROP MBR 5-1.5 MG/5ML PO SOLN: 5.0000 mL | Freq: Four times a day (QID) | ORAL | 0 refills | Status: DC | PRN

## 2021-10-19 MED ORDER — PREDNISONE 10 MG PO TABS: 10.0000 mg | ORAL_TABLET | Freq: Three times a day (TID) | ORAL | 0 refills | Status: DC

## 2021-10-19 NOTE — ED Provider Notes (Signed)
[place]    CSN: [id number] Arrival date & time: 10/19/21  0932      History   Chief Complaint Chief Complaint  Patient presents with   Cough    HPI Richard Duarte is a 61 y.o. male.   61 year old male presents with cough and chest congestion.  Patient indicates for the past week he has been having persistent cough where he is going into coughing fits and spasms and coughs forcefully for a period of time to where it makes him will a little short of breath.  He indicates that his production has been very little and has not been discolored.  He indicates he is not having any wheezing with the cough.  He has been taking some OTC cough preparations which has not given him much relief.  He indicates that he is not able to sleep at night due to going into coughing fits.  He does indicate he has minimal upper respiratory symptoms with some sinus congestion, postnasal drip and rhinitis mainly clear.  He indicates he is not having any fever, chills, body aches or pain.  He indicates he has not been around anyone who has been sick recently.  He does indicate several weeks ago he did get his flu vaccine and his COVID booster and did not have any problems with those when they were given.  He is tolerating fluids well. He does take several herbal and multivitamins as preventative measures.  He indicates that he does not take all of those that are listed every day.   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   Screening for prostate cancer 03/12/2021   Screen for colon cancer 03/12/2021   Screening for ischemic  heart disease 03/12/2021   Type II diabetes mellitus with complication ([place]) 81/85/6314   Hyperlipidemia associated with type 2 diabetes mellitus ([place]) 02/12/2021   Essential hypertension, benign 02/12/2021   Hypothyroidism 02/12/2021   Muscle cramping 02/12/2021   Tremor 02/12/2021   Hand weakness 02/12/2021   Grief reaction 11/02/2019   BMI 40.0-44.9, adult ([place]) 11/02/2019   Elevated glucose level 10/27/2019   Snoring 02/04/2017   Postoperative hypothyroidism 11/18/2016   Obesity due to excess calories without serious comorbidity 11/18/2016    Past Surgical History:  Procedure Laterality Date   THYROIDECTOMY         Home Medications    Prior to Admission medications   Medication Sig Start Date End Date Taking? Authorizing Provider  Acetylcysteine (N-ACETYL-L-CYSTEINE) 600 MG CAPS Take by mouth.   Yes [provider]  amLODipine (NORVASC) 10 MG tablet Take 1 tablet by mouth once daily 07/31/20  Yes Paige, Victoria J, DO  APPLE CIDER VINEGAR PO Take 450 mg by mouth.   Yes [provider]  Ascorbic Acid (VITAMIN C) 1000 MG tablet Take 1,000 mg by mouth daily.   Yes [provider]  atorvastatin (LIPITOR) 40 MG tablet Take 1 tablet (40 mg total) by mouth daily. 10/26/19  Yes Autry-Lott, Naaman Plummer, DO  Bacillus Coagulans-Inulin (PROBIOTIC FORMULA  PO) Take by mouth.   Yes [provider]  Cholecalciferol (VITAMIN D) 50 MCG (2000 UT) CAPS Take by mouth.   Yes [provider]  Chromium Picolinate 1000 MCG TABS Take by mouth.   Yes [provider]  Chromium Picolinate 1000 MCG TABS Take 1 tablet by mouth daily.   Yes [provider]  Cinnamon 500 MG TABS Take 1,000 mg by mouth.   Yes [provider]  Coenzyme Q10 100 MG TABS Take 200 mg by mouth.   Yes [provider]  Cyanocobalamin (VITAMIN B 12) 500 MCG TABS Take 1,000 mcg by mouth.   Yes [provider]  Digestive Enzymes (BETAINE HCL) 650-130  MG CAPS Take 650 mg by mouth.   Yes [provider]  doxylamine, Sleep, (UNISOM) 25 MG tablet Take by mouth.   Yes [provider]  Echinacea-Goldenseal (ECHINACEA COMB/GOLDEN SEAL PO) Take 900 mg by mouth.   Yes [provider]  Garlic 6433 MG CAPS Take by mouth.   Yes [provider]  Ginger, Zingiber officinalis, (GINGER ROOT) 550 MG CAPS Take by mouth.   Yes [provider]  Ginkgo Biloba 60 MG TABS Take by mouth.   Yes [provider]  Ginseng 100 MG CAPS Take 200 mg by mouth.   Yes [provider]  HAWTHORNE BERRY PO Take 565 mg by mouth.   Yes [provider]  hydrochlorothiazide (MICROZIDE) 12.5 MG capsule Take 1 capsule by mouth once daily 07/31/20  Yes Paige, Victoria J, DO  HYDROcodone bit-homatropine (HYCODAN) 5-1.5 MG/5ML syrup Take 5 mLs by mouth every 6 (six) hours as needed for cough. 10/19/21  Yes Nyoka Lint, PA-C  Krill Oil 350 MG CAPS Take by mouth.   Yes [provider]  levothyroxine (EUTHYROX) 175 MCG tablet Take 1 tablet (175 mcg total) by mouth daily before breakfast. 04/04/21  Yes Tysinger, Camelia Eng, PA-C  loratadine (CLARITIN) 10 MG tablet Take 1 tablet (10 mg total) by mouth daily. 04/30/19  Yes Nuala Alpha, MD  Magnesium 250 MG TABS Take by mouth.   Yes [provider]  melatonin 5 MG TABS Take 5 mg by mouth.   Yes [provider]  metFORMIN (GLUCOPHAGE-XR) 500 MG 24 hr tablet Take 2 tablets (1,000 mg total) by mouth daily. 08/03/21  Yes Tysinger, Camelia Eng, PA-C  Milk Thistle 1000 MG CAPS Take by mouth.   Yes [provider]  Misc Natural Products (LUNG TONIC PO) Take by mouth.   Yes [provider]  MISC NATURAL PRODUCTS PO Take 1,000 mg by mouth. Beet Root   Yes [provider]  MISC NATURAL PRODUCTS PO Take by mouth. Throat and voice herbal blend   Yes [provider]  Multiple Vitamin (MULTIVITAMIN) tablet Take 1 tablet by mouth  daily.   Yes [provider]  Nutritional Supplements (LIVER DEFENSE PO) Take by mouth.   Yes [provider]  Olmesartan-amLODIPine-HCTZ 40-10-12.5 MG TABS Take 1 tablet by mouth daily. 03/12/21  Yes Tysinger, Camelia Eng, PA-C  Omega-3 Fatty Acids (FISH OIL) 1200 MG CPDR Take 2,400 mg by mouth.   Yes [provider]  omeprazole (PRILOSEC) 20 MG capsule TAKE 1 TO 2 CAPSULES BY MOUTH TWICE DAILY 07/16/17  Yes Armbruster, Carlota Raspberry, MD  Plant Sterols and Stanols (CHOLESTOFF PLUS) 450 MG CAPS Take by mouth.   Yes [provider]  Potassium 99 MG TABS Take by mouth.   Yes [provider]  predniSONE (  DELTASONE) 10 MG tablet Take 1 tablet (10 mg total) by mouth in the morning, at noon, and at bedtime. 10/19/21  Yes Nyoka Lint, PA-C  Selenium 200 MCG CAPS Take by mouth.   Yes [provider]  Specialty Vitamins Products (ADVANCED COLLAGEN PO) Take 1,600 mg by mouth.   Yes [provider]  ST JOHNS WORT PO Take by mouth.   Yes [provider]  SUPER B COMPLEX/C PO Take by mouth.   Yes [provider]  Turmeric 500 MG TABS Take by mouth.   Yes [provider]  Valerian Root 500 MG CAPS Take by mouth.   Yes [provider]  Zinc 50 MG CAPS Take by mouth.   Yes [provider]  Zn-Pyg Afri-Nettle-Saw Palmet (SAW PALMETTO COMPLEX PO) Take 300 mg by mouth.   Yes [provider]    Family History Family History  Problem Relation Age of Onset   Dupuytren's contracture Mother    Alzheimer's disease Mother    Hypertension Mother    Dementia Mother    Diabetes Father    Hypertension Father    Thyroid cancer Sister    Dupuytren's contracture Sister    Diabetes Sister    Dementia Maternal Aunt    Colon cancer Neg Hx    Esophageal cancer Neg Hx    Pancreatic cancer Neg Hx    Stomach cancer Neg Hx     Social History Social History   Tobacco Use   Smoking status: Never   Smokeless  tobacco: Never  Vaping Use   Vaping Use: Never used  Substance Use Topics   Alcohol use: Yes    Comment: 2 nightly   Drug use: No     Allergies   Isovue [iopamidol], Amoxicillin, Penicillins, and Keflex [cephalexin]   Review of Systems Review of Systems  Respiratory:  Positive for cough.      Physical Exam Triage Vital Signs ED Triage Vitals  Enc Vitals Group     BP 10/19/21 1105 112/74     Pulse Rate 10/19/21 1105 77     Resp 10/19/21 1105 16     Temp 10/19/21 1105 98.3 F (36.8 C)     Temp src --      SpO2 10/19/21 1105 94 %     Weight --      Height --      Head Circumference --      Peak Flow --      Pain Score 10/19/21 1103 2     Pain Loc --      Pain Edu? --      Excl. in [place]? --    No data found.  Updated Vital Signs BP 112/74 (BP Location: Left Arm)   Pulse 77   Temp 98.3 F (36.8 C)   Resp 16   SpO2 94%   Visual Acuity Right Eye Distance:   Left Eye Distance:   Bilateral Distance:    Right Eye Near:   Left Eye Near:    Bilateral Near:     Physical Exam Constitutional:      Appearance: Normal appearance.  HENT:     Right Ear: Tympanic membrane and ear canal normal.     Left Ear: Tympanic membrane and ear canal normal.     Mouth/Throat:     Mouth: Mucous membranes are moist.     Pharynx: Oropharynx is clear.  Cardiovascular:     Rate and Rhythm: Normal rate and regular rhythm.  Heart sounds: Normal heart sounds.  Pulmonary:     Effort: Pulmonary effort is normal.     Breath sounds: Normal breath sounds and air entry. No wheezing, rhonchi or rales.  Lymphadenopathy:     Cervical: No cervical adenopathy.  Neurological:     Mental Status: He is alert.      UC Treatments / Results  Labs (all labs ordered are listed, but only abnormal results are displayed) Labs Reviewed - No data to display  EKG   Radiology No results found.  Procedures Procedures (including critical care time)  Medications Ordered in UC Medications  - No data to display  Initial Impression / Assessment and Plan / UC Course  I have reviewed the triage vital signs and the nursing notes.  Pertinent labs & imaging results that were available during my care of the patient were reviewed by me and considered in my medical decision making (see chart for details).    Plan: 1.  The bronchitis will be treated with the following: A.  Hycodan cough syrup, 1 to 2 teaspoons every 6-8 hours to help control cough and chest congestion. B.  Prednisone 10 mg, 1 every 8 hours until completed to help reduce the inflammatory respiratory component. 2.  The upper respiratory tract infection will be treated with the following: A.  Hycodan cough syrup, 1 to 2 teaspoons every 6-8 hours as needed for cough. 3.  Patient advised to follow-up with PCP or return to urgent care if symptoms fail to improve. Final Clinical Impressions(s) / UC Diagnoses   Final diagnoses:  Viral upper respiratory tract infection  Bronchitis     Discharge Instructions      Advised take the Hycodan cough syrup, 1 to 2 teaspoons every 6 hours as needed for coughing exacerbations and congestion. Advised take prednisone 10 mg 3 times a day until completed as this will help reduce respiratory inflammatory process. Advised to follow-up with PCP or return to urgent care if symptoms fail to improve.    ED Prescriptions     Medication Sig Dispense Auth. Provider   HYDROcodone bit-homatropine (HYCODAN) 5-1.5 MG/5ML syrup Take 5 mLs by mouth every 6 (six) hours as needed for cough. 120 mL Nyoka Lint, PA-C   predniSONE (DELTASONE) 10 MG tablet Take 1 tablet (10 mg total) by mouth in the morning, at noon, and at bedtime. 15 tablet Nyoka Lint, PA-C      I have reviewed the PDMP during this encounter.   Nyoka Lint, PA-C 10/19/21 1127

## 2021-10-19 NOTE — Discharge Instructions (Signed)
Advised take the Hycodan cough syrup, 1 to 2 teaspoons every 6 hours as needed for coughing exacerbations and congestion. Advised take prednisone 10 mg 3 times a day until completed as this will help reduce respiratory inflammatory process. Advised to follow-up with PCP or return to urgent care if symptoms fail to improve.

## 2021-10-19 NOTE — ED Triage Notes (Signed)
Patient having cough, fatigue, and body aches. States the cough is worse at night and slightly productive at night. Cough sever enough to cause chest pain. Onset last Friday. No fever and no known sick exposure.

## 2021-10-24 DIAGNOSIS — J069 Acute upper respiratory infection, unspecified: Secondary | ICD-10-CM | POA: Diagnosis not present

## 2021-11-01 DIAGNOSIS — R059 Cough, unspecified: Secondary | ICD-10-CM | POA: Diagnosis not present

## 2021-11-04 DIAGNOSIS — J209 Acute bronchitis, unspecified: Secondary | ICD-10-CM | POA: Diagnosis not present

## 2021-11-04 DIAGNOSIS — J069 Acute upper respiratory infection, unspecified: Secondary | ICD-10-CM | POA: Diagnosis not present

## 2021-11-04 DIAGNOSIS — R0982 Postnasal drip: Secondary | ICD-10-CM | POA: Diagnosis not present

## 2021-11-05 DIAGNOSIS — B372 Candidiasis of skin and nail: Secondary | ICD-10-CM | POA: Diagnosis not present

## 2021-11-09 ENCOUNTER — Ambulatory Visit (INDEPENDENT_AMBULATORY_CARE_PROVIDER_SITE_OTHER): Payer: BC Managed Care – PPO | Admitting: Podiatry

## 2021-11-09 ENCOUNTER — Encounter: Payer: Self-pay | Admitting: Podiatry

## 2021-11-09 DIAGNOSIS — G43009 Migraine without aura, not intractable, without status migrainosus: Secondary | ICD-10-CM | POA: Diagnosis not present

## 2021-11-09 DIAGNOSIS — E118 Type 2 diabetes mellitus with unspecified complications: Secondary | ICD-10-CM

## 2021-11-09 DIAGNOSIS — M79675 Pain in left toe(s): Secondary | ICD-10-CM | POA: Diagnosis not present

## 2021-11-09 DIAGNOSIS — M79674 Pain in right toe(s): Secondary | ICD-10-CM

## 2021-11-09 DIAGNOSIS — E782 Mixed hyperlipidemia: Secondary | ICD-10-CM | POA: Diagnosis not present

## 2021-11-09 DIAGNOSIS — E890 Postprocedural hypothyroidism: Secondary | ICD-10-CM | POA: Diagnosis not present

## 2021-11-09 DIAGNOSIS — B351 Tinea unguium: Secondary | ICD-10-CM | POA: Diagnosis not present

## 2021-11-09 DIAGNOSIS — I10 Essential (primary) hypertension: Secondary | ICD-10-CM | POA: Diagnosis not present

## 2021-11-09 NOTE — Progress Notes (Signed)
This patient returns to my office for at risk foot care.  This patient requires this care by a professional since this patient will be at risk due to having  diabetes.  This patient is unable to cut nails himself since the patient cannot reach his nails.These nails are painful walking and wearing shoes.  This patient presents for at risk foot care today.  General Appearance  Alert, conversant and in no acute stress.  Vascular  Dorsalis pedis and posterior tibial  pulses are palpable  bilaterally.  Capillary return is within normal limits  bilaterally. Temperature is within normal limits  bilaterally.  Neurologic  Senn-Weinstein monofilament wire test within normal limits  bilaterally. Muscle power within normal limits bilaterally.  Nails Thick disfigured discolored nails with subungual debris  from hallux to fifth toes bilaterally. No evidence of bacterial infection or drainage bilaterally.  Orthopedic  No limitations of motion  feet .  No crepitus or effusions noted.  No bony pathology or digital deformities noted.  Skin  normotropic skin with no porokeratosis noted bilaterally.  No signs of infections or ulcers noted.     Onychomycosis  Pain in right toes  Pain in left toes  Consent was obtained for treatment procedures.   Mechanical debridement of nails 1-5  bilaterally performed with a nail nipper.  Filed with dremel without incident.    Return office visit   3 months                   Told patient to return for periodic foot care and evaluation due to potential at risk complications.   Averiana Clouatre DPM   

## 2022-01-01 DIAGNOSIS — R059 Cough, unspecified: Secondary | ICD-10-CM | POA: Diagnosis not present

## 2022-01-01 DIAGNOSIS — E119 Type 2 diabetes mellitus without complications: Secondary | ICD-10-CM | POA: Diagnosis not present

## 2022-02-05 DIAGNOSIS — J069 Acute upper respiratory infection, unspecified: Secondary | ICD-10-CM | POA: Diagnosis not present

## 2022-02-05 DIAGNOSIS — R0982 Postnasal drip: Secondary | ICD-10-CM | POA: Diagnosis not present

## 2022-02-05 DIAGNOSIS — J019 Acute sinusitis, unspecified: Secondary | ICD-10-CM | POA: Diagnosis not present

## 2022-02-05 DIAGNOSIS — R051 Acute cough: Secondary | ICD-10-CM | POA: Diagnosis not present

## 2022-02-18 ENCOUNTER — Encounter: Payer: Self-pay | Admitting: Podiatry

## 2022-02-18 ENCOUNTER — Ambulatory Visit (INDEPENDENT_AMBULATORY_CARE_PROVIDER_SITE_OTHER): Payer: BC Managed Care – PPO | Admitting: Podiatry

## 2022-02-18 DIAGNOSIS — E118 Type 2 diabetes mellitus with unspecified complications: Secondary | ICD-10-CM

## 2022-02-18 DIAGNOSIS — B351 Tinea unguium: Secondary | ICD-10-CM

## 2022-02-18 DIAGNOSIS — M79675 Pain in left toe(s): Secondary | ICD-10-CM

## 2022-02-18 DIAGNOSIS — M79674 Pain in right toe(s): Secondary | ICD-10-CM

## 2022-02-18 NOTE — Progress Notes (Signed)
This patient returns to my office for at risk foot care.  This patient requires this care by a professional since this patient will be at risk due to having  diabetes.  This patient is unable to cut nails himself since the patient cannot reach his nails.These nails are painful walking and wearing shoes.  This patient presents for at risk foot care today.  General Appearance  Alert, conversant and in no acute stress.  Vascular  Dorsalis pedis and posterior tibial  pulses are palpable  bilaterally.  Capillary return is within normal limits  bilaterally. Temperature is within normal limits  bilaterally.  Neurologic  Senn-Weinstein monofilament wire test within normal limits  bilaterally. Muscle power within normal limits bilaterally.  Nails Thick disfigured discolored nails with subungual debris  from hallux to fifth toes bilaterally. No evidence of bacterial infection or drainage bilaterally.  Orthopedic  No limitations of motion  feet .  No crepitus or effusions noted.  No bony pathology or digital deformities noted.  Skin  normotropic skin with no porokeratosis noted bilaterally.  No signs of infections or ulcers noted.     Onychomycosis  Pain in right toes  Pain in left toes  Consent was obtained for treatment procedures.   Mechanical debridement of nails 1-5  bilaterally performed with a nail nipper.  Filed with dremel without incident.    Return office visit   3 months                   Told patient to return for periodic foot care and evaluation due to potential at risk complications.   Thanh Pomerleau DPM   

## 2022-02-19 DIAGNOSIS — Z1331 Encounter for screening for depression: Secondary | ICD-10-CM | POA: Diagnosis not present

## 2022-02-19 DIAGNOSIS — E8889 Other specified metabolic disorders: Secondary | ICD-10-CM | POA: Diagnosis not present

## 2022-02-19 DIAGNOSIS — Z9189 Other specified personal risk factors, not elsewhere classified: Secondary | ICD-10-CM | POA: Diagnosis not present

## 2022-02-19 DIAGNOSIS — E782 Mixed hyperlipidemia: Secondary | ICD-10-CM | POA: Diagnosis not present

## 2022-02-19 DIAGNOSIS — E119 Type 2 diabetes mellitus without complications: Secondary | ICD-10-CM | POA: Diagnosis not present

## 2022-02-19 DIAGNOSIS — I10 Essential (primary) hypertension: Secondary | ICD-10-CM | POA: Diagnosis not present

## 2022-03-05 DIAGNOSIS — E119 Type 2 diabetes mellitus without complications: Secondary | ICD-10-CM | POA: Diagnosis not present

## 2022-03-05 DIAGNOSIS — I10 Essential (primary) hypertension: Secondary | ICD-10-CM | POA: Diagnosis not present

## 2022-03-05 DIAGNOSIS — E782 Mixed hyperlipidemia: Secondary | ICD-10-CM | POA: Diagnosis not present

## 2022-03-16 ENCOUNTER — Other Ambulatory Visit: Payer: Self-pay | Admitting: Medical

## 2022-03-18 NOTE — Telephone Encounter (Signed)
Pt has switched to [employer]

## 2022-03-19 DIAGNOSIS — I10 Essential (primary) hypertension: Secondary | ICD-10-CM | POA: Diagnosis not present

## 2022-03-19 DIAGNOSIS — E782 Mixed hyperlipidemia: Secondary | ICD-10-CM | POA: Diagnosis not present

## 2022-03-19 DIAGNOSIS — E119 Type 2 diabetes mellitus without complications: Secondary | ICD-10-CM | POA: Diagnosis not present

## 2022-04-08 DIAGNOSIS — E782 Mixed hyperlipidemia: Secondary | ICD-10-CM | POA: Diagnosis not present

## 2022-04-08 DIAGNOSIS — E119 Type 2 diabetes mellitus without complications: Secondary | ICD-10-CM | POA: Diagnosis not present

## 2022-04-08 DIAGNOSIS — I10 Essential (primary) hypertension: Secondary | ICD-10-CM | POA: Diagnosis not present

## 2022-04-22 DIAGNOSIS — H40003 Preglaucoma, unspecified, bilateral: Secondary | ICD-10-CM | POA: Diagnosis not present

## 2022-04-22 DIAGNOSIS — H2513 Age-related nuclear cataract, bilateral: Secondary | ICD-10-CM | POA: Diagnosis not present

## 2022-04-22 DIAGNOSIS — Z7984 Long term (current) use of oral hypoglycemic drugs: Secondary | ICD-10-CM | POA: Diagnosis not present

## 2022-04-22 DIAGNOSIS — E1136 Type 2 diabetes mellitus with diabetic cataract: Secondary | ICD-10-CM | POA: Diagnosis not present

## 2022-04-23 DIAGNOSIS — R5383 Other fatigue: Secondary | ICD-10-CM | POA: Diagnosis not present

## 2022-04-23 DIAGNOSIS — E119 Type 2 diabetes mellitus without complications: Secondary | ICD-10-CM | POA: Diagnosis not present

## 2022-04-23 DIAGNOSIS — E782 Mixed hyperlipidemia: Secondary | ICD-10-CM | POA: Diagnosis not present

## 2022-04-23 DIAGNOSIS — I10 Essential (primary) hypertension: Secondary | ICD-10-CM | POA: Diagnosis not present

## 2022-05-07 DIAGNOSIS — R5383 Other fatigue: Secondary | ICD-10-CM | POA: Diagnosis not present

## 2022-05-07 DIAGNOSIS — E782 Mixed hyperlipidemia: Secondary | ICD-10-CM | POA: Diagnosis not present

## 2022-05-07 DIAGNOSIS — E119 Type 2 diabetes mellitus without complications: Secondary | ICD-10-CM | POA: Diagnosis not present

## 2022-05-07 DIAGNOSIS — E890 Postprocedural hypothyroidism: Secondary | ICD-10-CM | POA: Diagnosis not present

## 2022-05-07 DIAGNOSIS — Z6841 Body Mass Index (BMI) 40.0 and over, adult: Secondary | ICD-10-CM | POA: Diagnosis not present

## 2022-05-07 DIAGNOSIS — I10 Essential (primary) hypertension: Secondary | ICD-10-CM | POA: Diagnosis not present

## 2022-05-07 DIAGNOSIS — Z79899 Other long term (current) drug therapy: Secondary | ICD-10-CM | POA: Diagnosis not present

## 2022-05-10 DIAGNOSIS — E890 Postprocedural hypothyroidism: Secondary | ICD-10-CM | POA: Diagnosis not present

## 2022-05-10 DIAGNOSIS — Z0000 Encounter for general adult medical examination without abnormal findings: Secondary | ICD-10-CM | POA: Diagnosis not present

## 2022-05-10 DIAGNOSIS — E119 Type 2 diabetes mellitus without complications: Secondary | ICD-10-CM | POA: Diagnosis not present

## 2022-05-10 DIAGNOSIS — I10 Essential (primary) hypertension: Secondary | ICD-10-CM | POA: Diagnosis not present

## 2022-05-10 DIAGNOSIS — E782 Mixed hyperlipidemia: Secondary | ICD-10-CM | POA: Diagnosis not present

## 2022-05-20 ENCOUNTER — Encounter: Payer: Self-pay | Admitting: Podiatry

## 2022-05-20 ENCOUNTER — Ambulatory Visit (INDEPENDENT_AMBULATORY_CARE_PROVIDER_SITE_OTHER): Payer: BC Managed Care – PPO | Admitting: Podiatry

## 2022-05-20 DIAGNOSIS — E118 Type 2 diabetes mellitus with unspecified complications: Secondary | ICD-10-CM

## 2022-05-20 DIAGNOSIS — M79675 Pain in left toe(s): Secondary | ICD-10-CM

## 2022-05-20 DIAGNOSIS — M79674 Pain in right toe(s): Secondary | ICD-10-CM

## 2022-05-20 DIAGNOSIS — B351 Tinea unguium: Secondary | ICD-10-CM

## 2022-05-20 NOTE — Progress Notes (Signed)
This patient returns to my office for at risk foot care.  This patient requires this care by a professional since this patient will be at risk due to having diabetes.    This patient is unable to cut nails himself since the patient cannot reach his nails.These nails are painful walking and wearing shoes.  This patient presents for at risk foot care today.  General Appearance  Alert, conversant and in no acute stress.  Vascular  Dorsalis pedis and posterior tibial  pulses are palpable  bilaterally.  Capillary return is within normal limits  bilaterally. Temperature is within normal limits  bilaterally.  Neurologic  Senn-Weinstein monofilament wire test within normal limits  bilaterally. Muscle power within normal limits bilaterally.  Nails Thick disfigured discolored nails with subungual debris  from hallux to fifth toes bilaterally. No evidence of bacterial infection or drainage bilaterally.  Orthopedic  No limitations of motion  feet .  No crepitus or effusions noted.  No bony pathology or digital deformities noted.  Skin  normotropic skin with no porokeratosis noted bilaterally.  No signs of infections or ulcers noted.     Onychomycosis  Pain in right toes  Pain in left toes  Consent was obtained for treatment procedures.   Mechanical debridement of nails 1-5  bilaterally performed with a nail nipper.  Filed with dremel without incident.  Cauterized callus hallux bilaterally.   Return office visit     3 months                 Told patient to return for periodic foot care and evaluation due to potential at risk complications.   Helane Gunther DPM

## 2022-05-22 DIAGNOSIS — R809 Proteinuria, unspecified: Secondary | ICD-10-CM | POA: Diagnosis not present

## 2022-05-22 DIAGNOSIS — E118 Type 2 diabetes mellitus with unspecified complications: Secondary | ICD-10-CM | POA: Diagnosis not present

## 2022-05-27 DIAGNOSIS — E119 Type 2 diabetes mellitus without complications: Secondary | ICD-10-CM | POA: Diagnosis not present

## 2022-05-27 DIAGNOSIS — I10 Essential (primary) hypertension: Secondary | ICD-10-CM | POA: Diagnosis not present

## 2022-05-27 DIAGNOSIS — E782 Mixed hyperlipidemia: Secondary | ICD-10-CM | POA: Diagnosis not present

## 2022-05-27 DIAGNOSIS — N182 Chronic kidney disease, stage 2 (mild): Secondary | ICD-10-CM | POA: Diagnosis not present

## 2022-06-10 DIAGNOSIS — E782 Mixed hyperlipidemia: Secondary | ICD-10-CM | POA: Diagnosis not present

## 2022-07-15 ENCOUNTER — Ambulatory Visit: Payer: BC Managed Care – PPO | Admitting: Podiatry

## 2022-07-16 DIAGNOSIS — E890 Postprocedural hypothyroidism: Secondary | ICD-10-CM | POA: Diagnosis not present

## 2022-07-16 DIAGNOSIS — E119 Type 2 diabetes mellitus without complications: Secondary | ICD-10-CM | POA: Diagnosis not present

## 2022-07-16 DIAGNOSIS — Z85850 Personal history of malignant neoplasm of thyroid: Secondary | ICD-10-CM | POA: Diagnosis not present

## 2022-08-19 ENCOUNTER — Ambulatory Visit: Payer: BC Managed Care – PPO | Admitting: Podiatry

## 2022-08-20 ENCOUNTER — Ambulatory Visit: Payer: BC Managed Care – PPO | Admitting: Podiatry

## 2022-11-18 ENCOUNTER — Other Ambulatory Visit (HOSPITAL_COMMUNITY): Payer: Self-pay | Admitting: Family Medicine

## 2022-11-18 DIAGNOSIS — I10 Essential (primary) hypertension: Secondary | ICD-10-CM | POA: Diagnosis not present

## 2022-11-18 DIAGNOSIS — E890 Postprocedural hypothyroidism: Secondary | ICD-10-CM | POA: Diagnosis not present

## 2022-11-18 DIAGNOSIS — E119 Type 2 diabetes mellitus without complications: Secondary | ICD-10-CM | POA: Diagnosis not present

## 2022-11-18 DIAGNOSIS — E782 Mixed hyperlipidemia: Secondary | ICD-10-CM

## 2022-12-03 DIAGNOSIS — G4489 Other headache syndrome: Secondary | ICD-10-CM | POA: Diagnosis not present

## 2022-12-03 DIAGNOSIS — S0091XA Abrasion of unspecified part of head, initial encounter: Secondary | ICD-10-CM | POA: Diagnosis not present

## 2022-12-03 DIAGNOSIS — Y92414 Local residential or business street as the place of occurrence of the external cause: Secondary | ICD-10-CM | POA: Diagnosis not present

## 2022-12-03 DIAGNOSIS — E119 Type 2 diabetes mellitus without complications: Secondary | ICD-10-CM | POA: Diagnosis not present

## 2022-12-03 DIAGNOSIS — S0990XA Unspecified injury of head, initial encounter: Secondary | ICD-10-CM | POA: Diagnosis not present

## 2022-12-03 DIAGNOSIS — Z041 Encounter for examination and observation following transport accident: Secondary | ICD-10-CM | POA: Diagnosis not present

## 2022-12-03 DIAGNOSIS — M542 Cervicalgia: Secondary | ICD-10-CM | POA: Diagnosis not present

## 2022-12-03 DIAGNOSIS — S3991XA Unspecified injury of abdomen, initial encounter: Secondary | ICD-10-CM | POA: Diagnosis not present

## 2022-12-03 DIAGNOSIS — S098XXA Other specified injuries of head, initial encounter: Secondary | ICD-10-CM | POA: Diagnosis not present

## 2022-12-03 DIAGNOSIS — S0191XA Laceration without foreign body of unspecified part of head, initial encounter: Secondary | ICD-10-CM | POA: Diagnosis not present

## 2022-12-03 DIAGNOSIS — S3993XA Unspecified injury of pelvis, initial encounter: Secondary | ICD-10-CM | POA: Diagnosis not present

## 2022-12-03 DIAGNOSIS — R58 Hemorrhage, not elsewhere classified: Secondary | ICD-10-CM | POA: Diagnosis not present

## 2022-12-03 DIAGNOSIS — Z79899 Other long term (current) drug therapy: Secondary | ICD-10-CM | POA: Diagnosis not present

## 2022-12-03 DIAGNOSIS — Z7185 Encounter for immunization safety counseling: Secondary | ICD-10-CM | POA: Diagnosis not present

## 2022-12-03 DIAGNOSIS — S299XXA Unspecified injury of thorax, initial encounter: Secondary | ICD-10-CM | POA: Diagnosis not present

## 2022-12-03 DIAGNOSIS — M25532 Pain in left wrist: Secondary | ICD-10-CM | POA: Diagnosis not present

## 2022-12-03 DIAGNOSIS — X58XXXA Exposure to other specified factors, initial encounter: Secondary | ICD-10-CM | POA: Diagnosis not present

## 2022-12-03 DIAGNOSIS — S199XXA Unspecified injury of neck, initial encounter: Secondary | ICD-10-CM | POA: Diagnosis not present

## 2022-12-03 DIAGNOSIS — Z23 Encounter for immunization: Secondary | ICD-10-CM | POA: Diagnosis not present

## 2022-12-03 DIAGNOSIS — I10 Essential (primary) hypertension: Secondary | ICD-10-CM | POA: Diagnosis not present

## 2022-12-05 ENCOUNTER — Emergency Department (HOSPITAL_COMMUNITY)
Admission: EM | Admit: 2022-12-05 | Discharge: 2022-12-05 | Disposition: A | Discharge status: Home / Self Care | Payer: BC Managed Care – PPO | Attending: Emergency Medicine | Admitting: Emergency Medicine

## 2022-12-05 ENCOUNTER — Encounter (HOSPITAL_COMMUNITY): Payer: Self-pay | Admitting: Emergency Medicine

## 2022-12-05 ENCOUNTER — Emergency Department (HOSPITAL_COMMUNITY): Payer: BC Managed Care – PPO

## 2022-12-05 DIAGNOSIS — S50312A Abrasion of left elbow, initial encounter: Secondary | ICD-10-CM | POA: Diagnosis not present

## 2022-12-05 DIAGNOSIS — S63502A Unspecified sprain of left wrist, initial encounter: Secondary | ICD-10-CM | POA: Diagnosis not present

## 2022-12-05 DIAGNOSIS — M25532 Pain in left wrist: Secondary | ICD-10-CM | POA: Diagnosis not present

## 2022-12-05 DIAGNOSIS — Y92410 Unspecified street and highway as the place of occurrence of the external cause: Secondary | ICD-10-CM | POA: Insufficient documentation

## 2022-12-05 DIAGNOSIS — S63512A Sprain of carpal joint of left wrist, initial encounter: Secondary | ICD-10-CM | POA: Diagnosis not present

## 2022-12-05 DIAGNOSIS — S6992XA Unspecified injury of left wrist, hand and finger(s), initial encounter: Secondary | ICD-10-CM | POA: Diagnosis not present
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
## 2022-12-13 DIAGNOSIS — S0101XD Laceration without foreign body of scalp, subsequent encounter: Secondary | ICD-10-CM | POA: Diagnosis not present

## 2022-12-16 ENCOUNTER — Encounter (HOSPITAL_COMMUNITY): Payer: Self-pay

## 2022-12-16 ENCOUNTER — Ambulatory Visit (HOSPITAL_COMMUNITY)
Admission: EM | Admit: 2022-12-16 | Discharge: 2022-12-16 | Disposition: A | Discharge status: Home / Self Care | Payer: BC Managed Care – PPO | Attending: Family Medicine | Admitting: Family Medicine

## 2022-12-16 ENCOUNTER — Ambulatory Visit (INDEPENDENT_AMBULATORY_CARE_PROVIDER_SITE_OTHER): Payer: BC Managed Care – PPO

## 2022-12-16 DIAGNOSIS — R0789 Other chest pain: Secondary | ICD-10-CM | POA: Diagnosis not present

## 2022-12-16 DIAGNOSIS — R079 Chest pain, unspecified: Secondary | ICD-10-CM | POA: Diagnosis not present

## 2022-12-16 MED ORDER — KETOROLAC TROMETHAMINE 30 MG/ML IJ SOLN
30.0000 mg | Freq: Once | INTRAMUSCULAR | Status: AC
Start: 2022-12-16 — End: 2022-12-16
  Administered 2022-12-16: 30 mg via INTRAMUSCULAR

## 2022-12-16 MED ORDER — KETOROLAC TROMETHAMINE 30 MG/ML IJ SOLN
INTRAMUSCULAR | Status: AC
  Filled 2022-12-16: qty 1
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
## 2022-12-16 NOTE — Discharge Instructions (Addendum)
You were seen today for sternal pain.  Your xray does not show any abnormality.  As discussed, if the radiologist reads this differently we will notify you.   I have given you a shot of toradol for pain.  You may continue tylenol.  You may also take motrin/ibuprofen for the next several days.  You may take 800mg  every 8 hrs, or 600mg  every 6 hrs for several days.  I recommend you trial heat/ice as well to see if helpful.  If not improving then please return for re-evaluation, or go to the ER.

## 2022-12-18 DIAGNOSIS — E1169 Type 2 diabetes mellitus with other specified complication: Secondary | ICD-10-CM | POA: Diagnosis not present

## 2022-12-18 DIAGNOSIS — N289 Disorder of kidney and ureter, unspecified: Secondary | ICD-10-CM | POA: Diagnosis not present

## 2022-12-18 DIAGNOSIS — R0789 Other chest pain: Secondary | ICD-10-CM | POA: Diagnosis not present

## 2022-12-20 DIAGNOSIS — M25532 Pain in left wrist: Secondary | ICD-10-CM | POA: Diagnosis not present

## 2022-12-29 ENCOUNTER — Encounter (HOSPITAL_COMMUNITY): Payer: Self-pay

## 2022-12-29 ENCOUNTER — Inpatient Hospital Stay (HOSPITAL_COMMUNITY)
Admission: EM | Admit: 2022-12-29 | Discharge: 2022-12-31 | DRG: 378 | Disposition: A | Discharge status: Home / Self Care | Payer: BC Managed Care – PPO | Attending: Internal Medicine | Admitting: Internal Medicine

## 2022-12-29 ENCOUNTER — Emergency Department (HOSPITAL_COMMUNITY): Payer: BC Managed Care – PPO

## 2022-12-29 ENCOUNTER — Other Ambulatory Visit: Payer: Self-pay

## 2022-12-29 DIAGNOSIS — Z880 Allergy status to penicillin: Secondary | ICD-10-CM | POA: Diagnosis not present

## 2022-12-29 DIAGNOSIS — I10 Essential (primary) hypertension: Secondary | ICD-10-CM | POA: Diagnosis present

## 2022-12-29 DIAGNOSIS — F1090 Alcohol use, unspecified, uncomplicated: Secondary | ICD-10-CM | POA: Diagnosis not present

## 2022-12-29 DIAGNOSIS — Z789 Other specified health status: Secondary | ICD-10-CM | POA: Diagnosis present

## 2022-12-29 DIAGNOSIS — E119 Type 2 diabetes mellitus without complications: Secondary | ICD-10-CM | POA: Diagnosis not present

## 2022-12-29 DIAGNOSIS — Z820 Family history of epilepsy and other diseases of the nervous system: Secondary | ICD-10-CM

## 2022-12-29 DIAGNOSIS — Z79890 Hormone replacement therapy (postmenopausal): Secondary | ICD-10-CM | POA: Diagnosis not present

## 2022-12-29 DIAGNOSIS — Z791 Long term (current) use of non-steroidal anti-inflammatories (NSAID): Secondary | ICD-10-CM | POA: Diagnosis not present

## 2022-12-29 DIAGNOSIS — Z8249 Family history of ischemic heart disease and other diseases of the circulatory system: Secondary | ICD-10-CM | POA: Diagnosis not present

## 2022-12-29 DIAGNOSIS — Z881 Allergy status to other antibiotic agents status: Secondary | ICD-10-CM

## 2022-12-29 DIAGNOSIS — K922 Gastrointestinal hemorrhage, unspecified: Principal | ICD-10-CM | POA: Diagnosis present

## 2022-12-29 DIAGNOSIS — F1010 Alcohol abuse, uncomplicated: Secondary | ICD-10-CM | POA: Diagnosis present

## 2022-12-29 DIAGNOSIS — E785 Hyperlipidemia, unspecified: Secondary | ICD-10-CM | POA: Diagnosis not present

## 2022-12-29 DIAGNOSIS — Z6841 Body Mass Index (BMI) 40.0 and over, adult: Secondary | ICD-10-CM | POA: Diagnosis not present

## 2022-12-29 DIAGNOSIS — K5731 Diverticulosis of large intestine without perforation or abscess with bleeding: Secondary | ICD-10-CM | POA: Diagnosis not present

## 2022-12-29 DIAGNOSIS — Z808 Family history of malignant neoplasm of other organs or systems: Secondary | ICD-10-CM

## 2022-12-29 DIAGNOSIS — Z833 Family history of diabetes mellitus: Secondary | ICD-10-CM

## 2022-12-29 DIAGNOSIS — D62 Acute posthemorrhagic anemia: Secondary | ICD-10-CM | POA: Diagnosis present

## 2022-12-29 DIAGNOSIS — Z85850 Personal history of malignant neoplasm of thyroid: Secondary | ICD-10-CM

## 2022-12-29 DIAGNOSIS — E890 Postprocedural hypothyroidism: Secondary | ICD-10-CM | POA: Diagnosis present

## 2022-12-29 DIAGNOSIS — Z79899 Other long term (current) drug therapy: Secondary | ICD-10-CM

## 2022-12-29 DIAGNOSIS — K8020 Calculus of gallbladder without cholecystitis without obstruction: Secondary | ICD-10-CM | POA: Diagnosis not present

## 2022-12-29 DIAGNOSIS — K5730 Diverticulosis of large intestine without perforation or abscess without bleeding: Secondary | ICD-10-CM

## 2022-12-29 DIAGNOSIS — Z91041 Radiographic dye allergy status: Secondary | ICD-10-CM | POA: Diagnosis not present

## 2022-12-29 DIAGNOSIS — E118 Type 2 diabetes mellitus with unspecified complications: Secondary | ICD-10-CM | POA: Diagnosis present

## 2022-12-29 DIAGNOSIS — Z87828 Personal history of other (healed) physical injury and trauma: Secondary | ICD-10-CM

## 2022-12-29 DIAGNOSIS — K921 Melena: Secondary | ICD-10-CM | POA: Diagnosis not present

## 2022-12-29 DIAGNOSIS — H4010X0 Unspecified open-angle glaucoma, stage unspecified: Secondary | ICD-10-CM | POA: Diagnosis not present

## 2022-12-29 DIAGNOSIS — Z7984 Long term (current) use of oral hypoglycemic drugs: Secondary | ICD-10-CM

## 2022-12-29 DIAGNOSIS — K429 Umbilical hernia without obstruction or gangrene: Secondary | ICD-10-CM | POA: Diagnosis not present

## 2022-12-29 LAB — CBC
HCT: 36.3 % — ABNORMAL LOW (ref 39.0–52.0)
Hemoglobin: 12.1 g/dL — ABNORMAL LOW (ref 13.0–17.0)
MCH: 30.6 pg (ref 26.0–34.0)
MCHC: 33.3 g/dL (ref 30.0–36.0)
MCV: 91.9 fL (ref 80.0–100.0)
Platelets: 189 10*3/uL (ref 150–400)
RBC: 3.95 MIL/uL — ABNORMAL LOW (ref 4.22–5.81)
RDW: 12.5 % (ref 11.5–15.5)
WBC: 5.8 10*3/uL (ref 4.0–10.5)
nRBC: 0 % (ref 0.0–0.2)

## 2022-12-29 LAB — COMPREHENSIVE METABOLIC PANEL
ALT: 18 U/L (ref 0–44)
AST: 17 U/L (ref 15–41)
Albumin: 3.5 g/dL (ref 3.5–5.0)
Alkaline Phosphatase: 59 U/L (ref 38–126)
Anion gap: 11 (ref 5–15)
BUN: 23 mg/dL (ref 8–23)
CO2: 21 mmol/L — ABNORMAL LOW (ref 22–32)
Calcium: 9.3 mg/dL (ref 8.9–10.3)
Chloride: 106 mmol/L (ref 98–111)
Creatinine, Ser: 1.21 mg/dL (ref 0.61–1.24)
GFR, Estimated: >60 mL/min (ref >60)
Glucose, Bld: 110 mg/dL — ABNORMAL HIGH (ref 70–99)
Potassium: 4.2 mmol/L (ref 3.5–5.1)
Sodium: 138 mmol/L (ref 135–145)
Total Bilirubin: 1 mg/dL (ref <1.2)
Total Protein: 5.9 g/dL — ABNORMAL LOW (ref 6.5–8.1)

## 2022-12-29 LAB — ABO/RH: ABO/RH(D): A NEG

## 2022-12-29 LAB — PREPARE RBC (CROSSMATCH)

## 2022-12-29 LAB — PHOSPHORUS: Phosphorus: 3.8 mg/dL (ref 2.5–4.6)

## 2022-12-29 LAB — HEMOGLOBIN AND HEMATOCRIT, BLOOD
HCT: 34.3 % — ABNORMAL LOW (ref 39.0–52.0)
HCT: 29.7 % — ABNORMAL LOW (ref 39.0–52.0)
HCT: 28.4 % — ABNORMAL LOW (ref 39.0–52.0)
Hemoglobin: 11.4 g/dL — ABNORMAL LOW (ref 13.0–17.0)
Hemoglobin: 10.1 g/dL — ABNORMAL LOW (ref 13.0–17.0)
Hemoglobin: 9.6 g/dL — ABNORMAL LOW (ref 13.0–17.0)

## 2022-12-29 LAB — PROTIME-INR
INR: 1 (ref 0.8–1.2)
Prothrombin Time: 13.3 s (ref 11.4–15.2)

## 2022-12-29 LAB — MAGNESIUM: Magnesium: 1.5 mg/dL — ABNORMAL LOW (ref 1.7–2.4)

## 2022-12-29 LAB — HIV ANTIBODY (ROUTINE TESTING W REFLEX): HIV Screen 4th Generation wRfx: NONREACTIVE

## 2022-12-29 MED ORDER — ACETAMINOPHEN 650 MG RE SUPP: 650.0000 mg | Freq: Four times a day (QID) | RECTAL | Status: DC | PRN

## 2022-12-29 MED ORDER — LORATADINE 10 MG PO TABS: 10.0000 mg | ORAL_TABLET | Freq: Every day | ORAL | Status: DC | PRN

## 2022-12-29 MED ORDER — ALBUTEROL SULFATE (2.5 MG/3ML) 0.083% IN NEBU: 2.5000 mg | INHALATION_SOLUTION | Freq: Four times a day (QID) | RESPIRATORY_TRACT | Status: DC | PRN

## 2022-12-29 MED ORDER — SODIUM CHLORIDE 0.9 % IV SOLN: INTRAVENOUS | Status: AC

## 2022-12-29 MED ORDER — PANTOPRAZOLE SODIUM 40 MG IV SOLR
40.0000 mg | Freq: Two times a day (BID) | INTRAVENOUS | Status: DC
  Administered 2022-12-29 – 2022-12-31 (×3): 40 mg via INTRAVENOUS
  Filled 2022-12-29 (×3): qty 10

## 2022-12-29 MED ORDER — SODIUM CHLORIDE 0.9% IV SOLUTION: Freq: Once | INTRAVENOUS | Status: AC

## 2022-12-29 MED ORDER — IRBESARTAN 150 MG PO TABS: 300.0000 mg | ORAL_TABLET | Freq: Every day | ORAL | Status: DC

## 2022-12-29 MED ORDER — LORAZEPAM 2 MG/ML IJ SOLN: 1.0000 mg | INTRAMUSCULAR | Status: DC | PRN

## 2022-12-29 MED ORDER — FOLIC ACID 1 MG PO TABS
1.0000 mg | ORAL_TABLET | Freq: Every day | ORAL | Status: DC
  Administered 2022-12-29 – 2022-12-31 (×2): 1 mg via ORAL
  Filled 2022-12-29 (×2): qty 1

## 2022-12-29 MED ORDER — LEVOTHYROXINE SODIUM 75 MCG PO TABS
150.0000 ug | ORAL_TABLET | ORAL | Status: DC
  Administered 2022-12-29 – 2022-12-31 (×2): 150 ug via ORAL
  Filled 2022-12-29 (×2): qty 2

## 2022-12-29 MED ORDER — ONDANSETRON HCL 4 MG/2ML IJ SOLN: 4.0000 mg | Freq: Four times a day (QID) | INTRAMUSCULAR | Status: DC | PRN

## 2022-12-29 MED ORDER — THIAMINE HCL 100 MG/ML IJ SOLN: 100.0000 mg | Freq: Every day | INTRAMUSCULAR | Status: DC

## 2022-12-29 MED ORDER — ACETAMINOPHEN 325 MG PO TABS: 650.0000 mg | ORAL_TABLET | Freq: Four times a day (QID) | ORAL | Status: DC | PRN

## 2022-12-29 MED ORDER — LORAZEPAM 1 MG PO TABS
1.0000 mg | ORAL_TABLET | ORAL | Status: DC | PRN
Start: 2022-12-29 — End: 2023-01-01

## 2022-12-29 MED ORDER — THIAMINE MONONITRATE 100 MG PO TABS
100.0000 mg | ORAL_TABLET | Freq: Every day | ORAL | Status: DC
  Administered 2022-12-29 – 2022-12-31 (×2): 100 mg via ORAL
  Filled 2022-12-29 (×2): qty 1

## 2022-12-29 MED ORDER — PANTOPRAZOLE SODIUM 40 MG IV SOLR
40.0000 mg | Freq: Once | INTRAVENOUS | Status: AC
  Administered 2022-12-29: 40 mg via INTRAVENOUS
  Filled 2022-12-29: qty 10

## 2022-12-29 MED ORDER — ONDANSETRON HCL 4 MG PO TABS: 4.0000 mg | ORAL_TABLET | Freq: Four times a day (QID) | ORAL | Status: DC | PRN

## 2022-12-29 MED ORDER — IRBESARTAN 300 MG PO TABS
300.0000 mg | ORAL_TABLET | Freq: Every day | ORAL | Status: DC
  Filled 2022-12-29: qty 1

## 2022-12-29 MED ORDER — CYCLOBENZAPRINE HCL 5 MG PO TABS
10.0000 mg | ORAL_TABLET | Freq: Three times a day (TID) | ORAL | Status: DC | PRN
  Administered 2022-12-30: 10 mg via ORAL
  Filled 2022-12-29: qty 2

## 2022-12-29 MED ORDER — PEG-KCL-NACL-NASULF-NA ASC-C 100 G PO SOLR
0.5000 | Freq: Once | ORAL | Status: AC
  Administered 2022-12-29: 100 g via ORAL
  Filled 2022-12-29: qty 1

## 2022-12-29 MED ORDER — HYDROCHLOROTHIAZIDE 12.5 MG PO TABS: 12.5000 mg | ORAL_TABLET | Freq: Every day | ORAL | Status: DC

## 2022-12-29 MED ORDER — LEVOTHYROXINE SODIUM 75 MCG PO TABS
175.0000 ug | ORAL_TABLET | ORAL | Status: DC
  Filled 2022-12-29: qty 1

## 2022-12-29 MED ORDER — ATORVASTATIN CALCIUM 40 MG PO TABS
40.0000 mg | ORAL_TABLET | Freq: Every day | ORAL | Status: DC
  Administered 2022-12-29 – 2022-12-31 (×2): 40 mg via ORAL
  Filled 2022-12-29 (×2): qty 1

## 2022-12-29 MED ORDER — IOHEXOL 350 MG/ML SOLN
100.0000 mL | Freq: Once | INTRAVENOUS | Status: AC | PRN
  Administered 2022-12-29: 100 mL via INTRAVENOUS

## 2022-12-29 MED ORDER — HYDROCHLOROTHIAZIDE 12.5 MG PO TABS
12.5000 mg | ORAL_TABLET | Freq: Every day | ORAL | Status: DC
  Filled 2022-12-29: qty 1

## 2022-12-29 MED ORDER — PEG-KCL-NACL-NASULF-NA ASC-C 100 G PO SOLR
0.5000 | Freq: Once | ORAL | Status: DC
  Filled 2022-12-29: qty 1

## 2022-12-29 MED ORDER — OLMESARTAN MEDOXOMIL-HCTZ 40-12.5 MG PO TABS: 1.0000 | ORAL_TABLET | Freq: Every day | ORAL | Status: DC

## 2022-12-29 MED ORDER — ADULT MULTIVITAMIN W/MINERALS CH
1.0000 | ORAL_TABLET | Freq: Every day | ORAL | Status: DC
  Administered 2022-12-29 – 2022-12-31 (×2): 1 via ORAL
  Filled 2022-12-29 (×2): qty 1

## 2022-12-29 MED ORDER — PEG-KCL-NACL-NASULF-NA ASC-C 100 G PO SOLR: 1.0000 | Freq: Once | ORAL | Status: DC
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
## 2022-12-29 NOTE — ED Triage Notes (Signed)
Patient reports having rectal bleeding that started yesterday. States his stool is bright red and dark. Denies taking a blood thinners.  Denies abdominal pain, nausea, vomiting. History of hemorrhoids. No pain when having a bowel movement. States he also feels weak.
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
## 2022-12-29 NOTE — ED Notes (Signed)
ED TO INPATIENT HANDOFF REPORT  ED Nurse Name and Phone #: Ilene Qua [phone]  S Name/Age/Gender Richard Duarte 62 y.o. male Room/Bed: TRAAC/TRAAC  Code Status   Code Status: Not on file  Home/SNF/Other Home Patient oriented to: self, place, time, and situation Is this baseline? Yes   Triage Complete: Triage complete  Chief Complaint GI bleed [K92.2]  Triage Note Patient reports having rectal bleeding that started yesterday. States his stool is bright red and dark. Denies taking a blood thinners.  Denies abdominal pain, nausea, vomiting. History of hemorrhoids. No pain when having a bowel movement. States he also feels weak.     Allergies Allergies  Allergen Reactions   Other Hives   Isovue [Iopamidol] Hives    Pt states he had an IVP in the 80's and broke out in hives.      Amoxicillin    Penicillins    Keflex [Cephalexin] Hives    Level of Care/Admitting Diagnosis ED Disposition     ED Disposition  Admit   Condition  --   Comment  Hospital Area: [facility] [[id number]]  Level of Care: Telemetry Medical [104]  May place patient in observation at [facility] or [facility] if equivalent level of care is available:: No  Covid Evaluation: Asymptomatic - no recent exposure (last 10 days) testing not required  Diagnosis: GI bleed [237628]  Admitting Physician: Clydie Braun [[id number]]  Attending Physician: Clydie Braun [[id number]]          B Medical/Surgery History Past Medical History:  Diagnosis Date   Alcohol abuse, daily use    Anxiety    Cancer (HCC)    Thyroid   Depression    Hyperlipidemia    Hypertension    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   Thyroid disease    Past Surgical History:  Procedure Laterality Date   THYROIDECTOMY       A IV Location/Drains/Wounds Patient Lines/Drains/Airways Status     Active Line/Drains/Airways     Name Placement date Placement time Site Days    Peripheral IV 12/29/22 20 G Anterior;Left;Proximal Forearm 12/29/22  0516  Forearm  less than 1            Intake/Output Last 24 hours No intake or output data in the 24 hours ending 12/29/22 6269  Labs/Imaging Results for orders placed or performed during the hospital encounter of 12/29/22 (from the past 48 hours)  Type and screen [facility]     Status: None   Collection Time: 12/29/22  5:00 AM  Result Value Ref Range   ABO/RH(D) A NEG    Antibody Screen NEG    Sample Expiration      01/01/2023,2359 Performed at [facility], [address] [place], [place] [address]   ABO/Rh     Status: None   Collection Time: 12/29/22  5:10 AM  Result Value Ref Range   ABO/RH(D)      A NEG Performed at [facility], [address], [place], [place] [address]   Comprehensive metabolic panel     Status: Abnormal   Collection Time: 12/29/22  5:13 AM  Result Value Ref Range   Sodium 138 135 - 145 mmol/L   Potassium 4.2 3.5 - 5.1 mmol/L   Chloride 106 98 - 111 mmol/L   CO2 21 (L) 22 - 32 mmol/L   Glucose, Bld 110 (H) 70 - 99 mg/dL    Comment: Glucose  reference range applies only to samples taken after fasting for at least 8 hours.   BUN 23 8 - 23 mg/dL   Creatinine, Ser 0.45 0.61 - 1.24 mg/dL   Calcium 9.3 8.9 - 40.9 mg/dL   Total Protein 5.9 (L) 6.5 - 8.1 g/dL   Albumin 3.5 3.5 - 5.0 g/dL   AST 17 15 - 41 U/L   ALT 18 0 - 44 U/L   Alkaline Phosphatase 59 38 - 126 U/L   Total Bilirubin 1.0 <1.2 mg/dL   GFR, Estimated >81 >19 mL/min    Comment: (NOTE) Calculated using the CKD-EPI Creatinine Equation (2021)    Anion gap 11 5 - 15    Comment: Performed at [facility], [address] [place], [place] [address]  CBC     Status: Abnormal   Collection Time: 12/29/22  5:13 AM  Result Value Ref Range   WBC 5.8 4.0 - 10.5 K/uL   RBC 3.95 (L) 4.22 - 5.81 MIL/uL   Hemoglobin 12.1 (L) 13.0 - 17.0 g/dL   HCT 95.6 (L) 21.3 - 08.6 %   MCV 91.9 80.0 -  100.0 fL   MCH 30.6 26.0 - 34.0 pg   MCHC 33.3 30.0 - 36.0 g/dL   RDW 57.8 46.9 - 62.9 %   Platelets 189 150 - 400 K/uL   nRBC 0.0 0.0 - 0.2 %    Comment: Performed at [facility], [address], [place], [place] [address]  Protime-INR     Status: None   Collection Time: 12/29/22  7:50 AM  Result Value Ref Range   Prothrombin Time 13.3 11.4 - 15.2 seconds   INR 1.0 0.8 - 1.2    Comment: (NOTE) INR goal varies based on device and disease states. Performed at [facility], [address] [place], [place] [address]    CT ANGIO ABDOMEN PELVIS  W & WO CONTRAST Result Date: 12/29/2022 CLINICAL DATA:  Lower GI bleeding EXAM: CTA ABDOMEN AND PELVIS WITHOUT AND WITH CONTRAST TECHNIQUE: Multidetector CT imaging of the abdomen and pelvis was performed using the standard protocol during bolus administration of intravenous contrast. Multiplanar reconstructed images and MIPs were obtained and reviewed to evaluate the vascular anatomy. RADIATION DOSE REDUCTION: This exam was performed according to the departmental dose-optimization program which includes automated exposure control, adjustment of the mA and/or kV according to patient size and/or use of iterative reconstruction technique. CONTRAST:  OMNIPAQUE IOHEXOL 350 MG/ML SOLN COMPARISON:  None Available. FINDINGS: VASCULAR Aorta: Atheromatous plaque, primarily calcified. No dissection or aneurysm Celiac: Median arcuate ligament impresses on the celiac. Atheromatous calcification of the splenic artery. No flow reducing stenosis seen proximally. No branch occlusion or beading. SMA: Major branches are widely patent. No beading or aneurysm is seen. No evidence of active luminal extravasation. Renals: Vessels are symmetrically and sufficiently patent without aneurysm or beading. IMA: Patent Inflow: Moderate atheromatous plaque bilaterally. No ulceration or stenosis. Proximal Outflow: No significant stenosis. Veins: No veno occlusive disease  or stenosis detected. Review of the MIP images confirms the above findings. NON-VASCULAR Lower chest:  No contributory findings. Hepatobiliary: No focal liver abnormality.Filling of the gallbladder with calculi. No evidence of biliary inflammation or obstruction. Pancreas: Unremarkable. Spleen: Unremarkable. Adrenals/Urinary Tract: Negative adrenals. No hydronephrosis or stone. Unremarkable bladder. Stomach/Bowel: No localizing inflammation or mass lesion. There are innumerable distal colonic diverticula. Areas of luminal high-density or seen on precontrast phase and attributed to ingested material. No detected source of bleeding.  Lymphatic: No mass or adenopathy. Reproductive:No pathologic findings. Other: No ascites or pneumoperitoneum. Small fatty umbilical hernia. Musculoskeletal: No acute abnormalities. Lumbar spine degeneration which is generalized IMPRESSION: 1. No emergent finding. No implied bleeding source or detected active bleeding. 2. Extensive distal colonic diverticulosis. 3. Atherosclerosis without significant stenosis of major arteries. 4. Cholelithiasis. Electronically Signed   By: Tiburcio Pea M.D.   On: 12/29/2022 06:36    Pending Labs Unresulted Labs (From admission, onward)    None       Vitals/Pain Today's Vitals   12/29/22 0455 12/29/22 0456 12/29/22 0700  BP:  (!) 154/95 (!) 121/97  Pulse:  100   Resp:  15 (!) 21  Temp:  98.3 F (36.8 C)   TempSrc:  Oral   SpO2:  98% 98%  Weight: 160 lb (72.6 kg)    Height: 5\' 6"  (1.676 m)    PainSc: 0-No pain      Isolation Precautions No active isolations  Medications Medications  pantoprazole (PROTONIX) injection 40 mg (40 mg Intravenous Given 12/29/22 0546)  iohexol (OMNIPAQUE) 350 MG/ML injection 100 mL (100 mLs Intravenous Contrast Given 12/29/22 0617)    Mobility walks     Focused Assessments Cardiac Assessment Handoff:  Cardiac Rhythm: Normal sinus rhythm Lab Results  Component Value Date   CKTOTAL 280  (H) 09/19/2006   No results found for: "DDIMER" Does the Patient currently have chest pain? No    R Recommendations: See Admitting Provider Note  Report given to:   Additional Notes:
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
## 2022-12-29 NOTE — ED Notes (Addendum)
Patient was walking to the bathroom and became dizzy, diaphoretic and pale. Phlebotomist assisted the patient in to a wheelchair. Patient was advised to sit in the wheelchair and we would help him to the bathroom. Patient got up from the wheelchair unassisted and had a near syncopal episode. Patient was assisted back into the wheel chair. Patient did not fall, but stated he felt faint. Patient was again instructed to wait for the assistance of the nursing staff to help him. Patient was assisted to the bathroom for a bowel movement.

## 2022-12-29 NOTE — Progress Notes (Signed)
NEW ADMISSION NOTE New Admission Note:   Arrival Method: ambulated to BR Mental Orientation: alert and oriented x 4 Telemetry: verified by second person Assessment: Completed Skin: intact IV: PIV Pain: denies Tubes: none Safety Measures: Safety Fall Prevention Plan has been given, discussed and signed Admission: Completed [facility] Orientation: Patient has been orientated to the room, unit and staff.  Family: none present, pt notified  Dr. Leone Payor viewed blood in BR that pt is passing per rectum, no stool. 3 trips since arrival from ER. Colonoscopy planned.  Orders have been reviewed and implemented. Will continue to monitor the patient. Call light has been placed within reach and bed alarm has been activated.   Irwin Brakeman, RN

## 2022-12-29 NOTE — ED Notes (Signed)
Walked into the room, patient is up attempting to grab his phone. Patient was again advised to ask for the assistance of the nursing staff.

## 2022-12-30 ENCOUNTER — Observation Stay (HOSPITAL_COMMUNITY): Payer: BC Managed Care – PPO | Admitting: Certified Registered Nurse Anesthetist

## 2022-12-30 ENCOUNTER — Encounter (HOSPITAL_COMMUNITY)
Admission: EM | Disposition: A | Discharge status: Home / Self Care | Payer: Self-pay | Source: Home / Self Care | Attending: Pulmonary Disease

## 2022-12-30 ENCOUNTER — Observation Stay (HOSPITAL_COMMUNITY): Payer: BC Managed Care – PPO

## 2022-12-30 ENCOUNTER — Encounter (HOSPITAL_COMMUNITY): Payer: Self-pay | Admitting: Internal Medicine

## 2022-12-30 DIAGNOSIS — D62 Acute posthemorrhagic anemia: Secondary | ICD-10-CM | POA: Diagnosis present

## 2022-12-30 DIAGNOSIS — K922 Gastrointestinal hemorrhage, unspecified: Secondary | ICD-10-CM | POA: Diagnosis present

## 2022-12-30 DIAGNOSIS — Z880 Allergy status to penicillin: Secondary | ICD-10-CM | POA: Diagnosis not present

## 2022-12-30 DIAGNOSIS — E119 Type 2 diabetes mellitus without complications: Secondary | ICD-10-CM | POA: Diagnosis present

## 2022-12-30 DIAGNOSIS — E785 Hyperlipidemia, unspecified: Secondary | ICD-10-CM | POA: Diagnosis present

## 2022-12-30 DIAGNOSIS — F1010 Alcohol abuse, uncomplicated: Secondary | ICD-10-CM | POA: Diagnosis present

## 2022-12-30 DIAGNOSIS — H4010X0 Unspecified open-angle glaucoma, stage unspecified: Secondary | ICD-10-CM | POA: Diagnosis present

## 2022-12-30 DIAGNOSIS — K921 Melena: Secondary | ICD-10-CM

## 2022-12-30 DIAGNOSIS — K5731 Diverticulosis of large intestine without perforation or abscess with bleeding: Secondary | ICD-10-CM | POA: Diagnosis not present

## 2022-12-30 DIAGNOSIS — K429 Umbilical hernia without obstruction or gangrene: Secondary | ICD-10-CM | POA: Diagnosis not present

## 2022-12-30 DIAGNOSIS — Z881 Allergy status to other antibiotic agents status: Secondary | ICD-10-CM | POA: Diagnosis not present

## 2022-12-30 DIAGNOSIS — K5730 Diverticulosis of large intestine without perforation or abscess without bleeding: Secondary | ICD-10-CM | POA: Diagnosis not present

## 2022-12-30 DIAGNOSIS — K8020 Calculus of gallbladder without cholecystitis without obstruction: Secondary | ICD-10-CM | POA: Diagnosis not present

## 2022-12-30 DIAGNOSIS — Z79890 Hormone replacement therapy (postmenopausal): Secondary | ICD-10-CM | POA: Diagnosis not present

## 2022-12-30 DIAGNOSIS — Z833 Family history of diabetes mellitus: Secondary | ICD-10-CM | POA: Diagnosis not present

## 2022-12-30 DIAGNOSIS — Z79899 Other long term (current) drug therapy: Secondary | ICD-10-CM | POA: Diagnosis not present

## 2022-12-30 DIAGNOSIS — Z6841 Body Mass Index (BMI) 40.0 and over, adult: Secondary | ICD-10-CM | POA: Diagnosis not present

## 2022-12-30 DIAGNOSIS — Z791 Long term (current) use of non-steroidal anti-inflammatories (NSAID): Secondary | ICD-10-CM | POA: Diagnosis not present

## 2022-12-30 DIAGNOSIS — I10 Essential (primary) hypertension: Secondary | ICD-10-CM | POA: Diagnosis present

## 2022-12-30 DIAGNOSIS — Z85850 Personal history of malignant neoplasm of thyroid: Secondary | ICD-10-CM | POA: Diagnosis not present

## 2022-12-30 DIAGNOSIS — Z91041 Radiographic dye allergy status: Secondary | ICD-10-CM | POA: Diagnosis not present

## 2022-12-30 DIAGNOSIS — E890 Postprocedural hypothyroidism: Secondary | ICD-10-CM | POA: Diagnosis present

## 2022-12-30 DIAGNOSIS — Z808 Family history of malignant neoplasm of other organs or systems: Secondary | ICD-10-CM | POA: Diagnosis not present

## 2022-12-30 DIAGNOSIS — Z820 Family history of epilepsy and other diseases of the nervous system: Secondary | ICD-10-CM | POA: Diagnosis not present

## 2022-12-30 DIAGNOSIS — Z7984 Long term (current) use of oral hypoglycemic drugs: Secondary | ICD-10-CM | POA: Diagnosis not present

## 2022-12-30 DIAGNOSIS — Z8249 Family history of ischemic heart disease and other diseases of the circulatory system: Secondary | ICD-10-CM | POA: Diagnosis not present

## 2022-12-30 HISTORY — PX: COLONOSCOPY WITH PROPOFOL: SHX5780

## 2022-12-30 LAB — CBC
HCT: 25.5 % — ABNORMAL LOW (ref 39.0–52.0)
Hemoglobin: 9.2 g/dL — ABNORMAL LOW (ref 13.0–17.0)
MCH: 30.5 pg (ref 26.0–34.0)
MCHC: 36.1 g/dL — ABNORMAL HIGH (ref 30.0–36.0)
MCV: 84.4 fL (ref 80.0–100.0)
Platelets: 189 10*3/uL (ref 150–400)
RBC: 3.02 MIL/uL — ABNORMAL LOW (ref 4.22–5.81)
RDW: 13.7 % (ref 11.5–15.5)
WBC: 10.3 10*3/uL (ref 4.0–10.5)
nRBC: 0 % (ref 0.0–0.2)

## 2022-12-30 LAB — BASIC METABOLIC PANEL
Anion gap: 12 (ref 5–15)
BUN: 17 mg/dL (ref 8–23)
CO2: 18 mmol/L — ABNORMAL LOW (ref 22–32)
Calcium: 7.7 mg/dL — ABNORMAL LOW (ref 8.9–10.3)
Chloride: 100 mmol/L (ref 98–111)
Creatinine, Ser: 1.06 mg/dL (ref 0.61–1.24)
GFR, Estimated: >60 mL/min (ref >60)
Glucose, Bld: 116 mg/dL — ABNORMAL HIGH (ref 70–99)
Potassium: 4.1 mmol/L (ref 3.5–5.1)
Sodium: 130 mmol/L — ABNORMAL LOW (ref 135–145)

## 2022-12-30 LAB — MRSA NEXT GEN BY PCR, NASAL: MRSA by PCR Next Gen: NOT DETECTED

## 2022-12-30 LAB — HEMOGLOBIN AND HEMATOCRIT, BLOOD
HCT: 24.9 % — ABNORMAL LOW (ref 39.0–52.0)
Hemoglobin: 8.9 g/dL — ABNORMAL LOW (ref 13.0–17.0)

## 2022-12-30 LAB — MAGNESIUM: Magnesium: 2.1 mg/dL (ref 1.7–2.4)

## 2022-12-30 LAB — PREPARE RBC (CROSSMATCH)

## 2022-12-30 SURGERY — COLONOSCOPY WITH PROPOFOL
Anesthesia: Monitor Anesthesia Care

## 2022-12-30 MED ORDER — PHENYLEPHRINE 80 MCG/ML (10ML) SYRINGE FOR IV PUSH (FOR BLOOD PRESSURE SUPPORT)
PREFILLED_SYRINGE | INTRAVENOUS | Status: DC | PRN
  Administered 2022-12-30: 160 ug via INTRAVENOUS
  Administered 2022-12-30: 80 ug via INTRAVENOUS
  Administered 2022-12-30: 160 ug via INTRAVENOUS
  Administered 2022-12-30 (×3): 80 ug via INTRAVENOUS
  Administered 2022-12-30 (×3): 160 ug via INTRAVENOUS
  Administered 2022-12-30 (×4): 80 ug via INTRAVENOUS

## 2022-12-30 MED ORDER — MAGNESIUM SULFATE 2 GM/50ML IV SOLN
2.0000 g | Freq: Once | INTRAVENOUS | Status: AC
  Administered 2022-12-30: 2 g via INTRAVENOUS
  Filled 2022-12-30: qty 50

## 2022-12-30 MED ORDER — SODIUM CHLORIDE 0.9% IV SOLUTION: Freq: Once | INTRAVENOUS | Status: AC

## 2022-12-30 MED ORDER — IOHEXOL 350 MG/ML SOLN
75.0000 mL | Freq: Once | INTRAVENOUS | Status: AC | PRN
  Administered 2022-12-30: 75 mL via INTRAVENOUS

## 2022-12-30 MED ORDER — SODIUM CHLORIDE 0.9 % IV SOLN: INTRAVENOUS | Status: DC

## 2022-12-30 MED ORDER — ORAL CARE MOUTH RINSE: 15.0000 mL | OROMUCOSAL | Status: DC | PRN

## 2022-12-30 MED ORDER — SODIUM CHLORIDE 0.9 % IV SOLN: INTRAVENOUS | Status: DC | PRN

## 2022-12-30 MED ORDER — SODIUM CHLORIDE 0.9 % IV BOLUS
250.0000 mL | INTRAVENOUS | Status: AC
  Administered 2022-12-30: 250 mL via INTRAVENOUS

## 2022-12-30 MED ORDER — MIDODRINE HCL 5 MG PO TABS
10.0000 mg | ORAL_TABLET | ORAL | Status: DC
Start: 2022-12-30 — End: 2022-12-30
  Filled 2022-12-30: qty 2

## 2022-12-30 MED ORDER — CHLORHEXIDINE GLUCONATE CLOTH 2 % EX PADS
6.0000 | MEDICATED_PAD | Freq: Every day | CUTANEOUS | Status: DC
  Administered 2022-12-30: 6 via TOPICAL

## 2022-12-30 MED ORDER — PROPOFOL 10 MG/ML IV BOLUS
INTRAVENOUS | Status: DC | PRN
  Administered 2022-12-30: 60 mg via INTRAVENOUS
  Administered 2022-12-30: 150 ug/kg/min via INTRAVENOUS

## 2022-12-30 SURGICAL SUPPLY — 20 items
ELECT REM PT RETURN 9FT ADLT (ELECTROSURGICAL)
ELECTRODE REM PT RTRN 9FT ADLT (ELECTROSURGICAL) IMPLANT
FCP BXJMBJMB 240X2.8X (CUTTING FORCEPS)
FLOOR PAD 36X40 (MISCELLANEOUS) ×1
FORCEPS BIOP RAD 4 LRG CAP 4 (CUTTING FORCEPS) IMPLANT
FORCEPS BXJMBJMB 240X2.8X (CUTTING FORCEPS) IMPLANT
INJECTOR/SNARE I SNARE (MISCELLANEOUS) IMPLANT
LUBRICANT JELLY 4.5OZ STERILE (MISCELLANEOUS) IMPLANT
MANIFOLD NEPTUNE II (INSTRUMENTS) IMPLANT
NDL SCLEROTHERAPY 25GX240 (NEEDLE) IMPLANT
NEEDLE SCLEROTHERAPY 25GX240 (NEEDLE) IMPLANT
PAD FLOOR 36X40 (MISCELLANEOUS) ×1 IMPLANT
PROBE APC STR FIRE (PROBE) IMPLANT
PROBE INJECTION GOLD 7FR (MISCELLANEOUS) IMPLANT
SNARE ROTATE MED OVAL 20MM (MISCELLANEOUS) IMPLANT
SYR 50ML LL SCALE MARK (SYRINGE) IMPLANT
TRAP SPECIMEN MUCOUS 40CC (MISCELLANEOUS) IMPLANT
TUBING ENDO SMARTCAP PENTAX (MISCELLANEOUS) IMPLANT
TUBING IRRIGATION ENDOGATOR (MISCELLANEOUS) ×1 IMPLANT
WATER STERILE IRR 1000ML POUR (IV SOLUTION) IMPLANT

## 2022-12-30 NOTE — Progress Notes (Signed)
Patient is alert, awake, arrived from ER with 1 unit RBC transfusion .Completed 1 unit   Blood transfusion , no blood transfusion reaction noted.

## 2022-12-30 NOTE — Progress Notes (Signed)
Overnight events noted.  Patient was transferred to the ICU.  Discussed with Dr. Denese Killings.  Patient seems to have stabilized.  Will keep in ICU for today.  GI to see and consider doing colonoscopy.  Patient was seen by IR and they do not think that he is a candidate for IR intervention at this time.  Vital signs noted to be stable currently.  Await labs from this morning.  Abdomen is benign on examination.  Other issues as per H&P and critical care notes.  TRH will be available to resume care from tomorrow if he remains stable.  Osvaldo Shipper 12/30/2022

## 2022-12-30 NOTE — Anesthesia Preprocedure Evaluation (Addendum)
Anesthesia Evaluation  Patient identified by MRN, date of birth, ID band Patient awake    Reviewed: Allergy & Precautions, NPO status , Patient's Chart, lab work & pertinent test results  Airway Mallampati: IV  TM Distance: <3 FB Neck ROM: Full    Dental  (+) Teeth Intact, Dental Advisory Given   Pulmonary neg pulmonary ROS   breath sounds clear to auscultation       Cardiovascular hypertension, Pt. on medications  Rhythm:Regular Rate:Tachycardia     Neuro/Psych  PSYCHIATRIC DISORDERS Anxiety Depression    negative neurological ROS     GI/Hepatic Neg liver ROS,GERD  Medicated,,  Endo/Other  diabetes, Type 2, Oral Hypoglycemic AgentsHypothyroidism    Renal/GU negative Renal ROS     Musculoskeletal negative musculoskeletal ROS (+)    Abdominal   Peds  Hematology  (+) Blood dyscrasia, anemia   Anesthesia Other Findings   Reproductive/Obstetrics                             Anesthesia Physical Anesthesia Plan  ASA: 3  Anesthesia Plan: MAC   Post-op Pain Management: Minimal or no pain anticipated   Induction: Intravenous  PONV Risk Score and Plan: 0 and Propofol infusion  Airway Management Planned: Natural Airway and Nasal Cannula  Additional Equipment: None  Intra-op Plan:   Post-operative Plan:   Informed Consent: I have reviewed the patients History and Physical, chart, labs and discussed the procedure including the risks, benefits and alternatives for the proposed anesthesia with the patient or authorized representative who has indicated his/her understanding and acceptance.       Plan Discussed with: CRNA  Anesthesia Plan Comments:        Anesthesia Quick Evaluation

## 2022-12-30 NOTE — Interval H&P Note (Signed)
History and Physical Interval Note:  12/30/2022 9:24 AM  Richard Duarte  has presented today for surgery, with the diagnosis of hematochezia.  The various methods of treatment have been discussed with the patient and family. After consideration of risks, benefits and other options for treatment, the patient has consented to  Procedure(s): COLONOSCOPY WITH PROPOFOL (N/A) as a surgical intervention.  The patient's history has been reviewed, patient examined, no change in status, stable for surgery.  I have reviewed the patient's chart and labs.  Questions were answered to the patient's satisfaction.    Patient has demonstrated ongoing brisk bleeding throughout the night.  Received 4 units pRBCs.  Repeat CT-A negative for arterial bleed.  Blood pressure stable, no pressors, but patient appears pale and weak.  Will attempt colonoscopy for hemostasis this morning.  Jenel Lucks

## 2022-12-30 NOTE — Op Note (Addendum)
[facility] Patient Name: Richard Duarte Procedure Date : 12/30/2022 MRN: [id number] Attending MD: Dub Amis. Tomasa Rand , MD, [id number] Date of Birth: 11/28/1960 CSN: [id number] Age: 62 Admit Type: Inpatient Procedure:                Colonoscopy Indications:              Hematochezia Providers:                Lorin Picket E. Tomasa Rand, MD, Suzy Bouchard, RN, Sunday Corn                            [place], Technician Referring MD:              Medicines:                Monitored Anesthesia Care Complications:            No immediate complications. Estimated Blood Loss:     Estimated blood loss: none. Procedure:                Pre-Anesthesia Assessment:                           - Prior to the procedure, a History and Physical                            was performed, and patient medications and                            allergies were reviewed. The patient's tolerance of                            previous anesthesia was also reviewed. The risks                            and benefits of the procedure and the sedation                            options and risks were discussed with the patient.                            All questions were answered, and informed consent                            was obtained. Prior Anticoagulants: The patient has                            taken no anticoagulant or antiplatelet agents. ASA                            Grade Assessment: II - A patient with mild systemic                            disease. After reviewing the risks and benefits,  the patient was deemed in satisfactory condition to                            undergo the procedure.                           After obtaining informed consent, the colonoscope                            was passed under direct vision. Throughout the                            procedure, the patient's blood pressure, pulse, and                            oxygen saturations were  monitored continuously. The                            CF-HQ190L ([id number]) Olympus coloscope was                            introduced through the anus and advanced to the the                            terminal ileum, with identification of the                            appendiceal orifice and IC valve. The colonoscopy                            was performed without difficulty. The patient                            tolerated the procedure well. The quality of the                            bowel preparation was fair. The terminal ileum,                            ileocecal valve, appendiceal orifice, and rectum                            were photographed. The bowel preparation used was                            MoviPrep via split dose instruction. Scope In: 10:45:32 AM Scope Out: 11:30:26 AM Scope Withdrawal Time: 0 hours 37 minutes 33 seconds  Total Procedure Duration: 0 hours 44 minutes 54 seconds  Findings:      The perianal and digital rectal examinations were normal. Pertinent       negatives include normal sphincter tone and no palpable rectal lesions.      Multiple large-mouthed, medium-mouthed and small-mouthed diverticula       were found in the sigmoid colon, descending colon, transverse colon and  ascending colon. The diverticulosis was most severe in the descending       and sigmoid colon. There were a few scattered diverticula in the       ascending and transverse colon.      Red blood was found in the entire colon. The largest amount of blood was       seen in the left colon, although there was fresh blood throughout the       colon as well as the terminal ileum. There was no active bleeding and no       adherent clots or other stigmata identified on any of the diverticula       despite examining the entire colon twice.      The exam was otherwise normal throughout the examined colon.      The terminal ileum contained red blood.      The remainder of the exam in  the terminal ileum was normal.      The retroflexed view of the distal rectum and anal verge was normal and       showed no anal or rectal abnormalities. Impression:               - Preparation of the colon was fair in that there                            was adherent blood along the walls throughout the                            colon.                           - Severe diverticulosis in the sigmoid colon, in                            the descending colon, in the transverse colon and                            in the ascending colon. The cecum and terminal                            ileum were identified and detailed inspection of                            the colon was performed. Once the rectum was                            reached and no bleeding culprit was identified, the                            colonscope was advanced again to the terminal ileum                            and a second inspection of the colon was performed.                            No fresh blood was encountered on the second  examination of the colon.                           - Blood in the entire examined colon, but no active                            bleeding and no culprit diverticulum identified                            from the many dozens of diverticula present                           - Blood in the terminal ileum.                           - The distal rectum and anal verge are normal on                            retroflexion view.                           - No specimens collected.                           - Suspect the patient's bleed is diverticular in                            etiology based on his presentation and endoscopic                            findings. The presence of blood in the terminal                            ileum does raise the remote possibility of a                            bleeding source proximal to the colon (Merkel's                             diverticulum?), but I suspect the blood is                            refluxate from the cecum. Moderate Sedation:      N/A Recommendation:           - Return patient to hospital ward for ongoing care.                           - Full liquid diet today.                           - Continue present medications.                           - Continue to monitor for evidence of bleeding. If  repeat brisk bleeding, would consider tagged RBC                            scan to attempt to localize bleed further. Procedure Code(s):        --- Professional ---                           [phone], Colonoscopy, flexible; diagnostic, including                            collection of specimen(s) by brushing or washing,                            when performed (separate procedure) Diagnosis Code(s):        --- Professional ---                           K57.31, Diverticulosis of large intestine without                            perforation or abscess with bleeding                           K92.2, Gastrointestinal hemorrhage, unspecified                           K92.1, Melena (includes Hematochezia) CPT copyright 2022 American [employer] Association. All rights reserved. The codes documented in this report are preliminary and upon coder review may  be revised to meet current compliance requirements. Umaima Scholten E. Tomasa Rand, MD 12/30/2022 11:56:41 AM This report has been signed electronically. Number of Addenda: 0

## 2022-12-30 NOTE — Progress Notes (Addendum)
The patient continues to have bright red blood per rectum requiring multiple blood transfusions (total of 4 units PRBCs ordered), has become pale with dizziness upon sitting up and standing.  Discussed with Dr. Myrtie Neither, [facility].  Due to concern for developing hemorrhagic shock, the patient was transferred to ICU under the guidance of Dr. Lonzo Candy, CCM night attending.  CTA GI bleed is positive, possible diverticular bleed.  Stat consult placed for interventional radiology.  Discussed with Dr. Bryn Gulling, on-call for IR, he is coming at bedside to see the patient.  Continue n.p.o. in case interventional procedure is required.   Addendum:  Per IR, Dr. Bryn Gulling, nothing to do from IR perspective at this time, since no apparent arterial bleed.  Recommends scoping.  [facility] made aware.    We appreciate all the specialists and critical care medicine's assistance.  We will continue to closely monitor and treat as indicated.  Critical care time: 35 minutes.

## 2022-12-30 NOTE — Progress Notes (Signed)
Patient arrived from ER at 0110 , 12/30/22. Pt is alert, awake active GI bleeding, hgb 9.6, completed 1 units RBC . PT was transported to CT while having transfusion second units of RBC, BP fluctuated while on the way to CT, BP @ 0345 , 15 minutes post transfusion, BP read on monitor 99/53 , at 0400 140/66 hr 97, oxygen 99 ra. Completed 1 unit RBC transfusion, no transfusion reaction noted. Patient has large amount of rectal bleeding. MD aware. PT was transferred to ICU .

## 2022-12-30 NOTE — Progress Notes (Signed)
   12/30/22 1622  Spiritual Encounters  Type of Visit Attempt (pt unavailable)  Conversation partners present during encounter Nurse  Reason for visit Advance directives   Attempted patient not available.
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
## 2022-12-30 NOTE — Progress Notes (Signed)
Critical Care Progress  No further active bleeding clinically or on endoscopy.   Hb stable at 8.9  On examination, awake and alert. No abdominal pain.   - Transfer out and potentially discharge soon if no active bleeding/Stable HB   Lynnell Catalan, MD [facility] ICU Physician [facility] [place] [facility]  Pager: [phone] Or Epic Secure Chat After hours: [phone].  12/30/2022, 3:46 PM

## 2022-12-30 NOTE — Transfer of Care (Signed)
Immediate Anesthesia Transfer of Care Note  Patient: Richard Duarte  Procedure(s) Performed: COLONOSCOPY WITH PROPOFOL  Patient Location: Endoscopy Unit  Anesthesia Type:MAC  Level of Consciousness: awake, alert , and oriented  Airway & Oxygen Therapy: Patient Spontanous Breathing  Post-op Assessment: Report given to RN, Post -op Vital signs reviewed and stable, Patient moving all extremities X 4, and Patient able to stick tongue midline  Post vital signs: Reviewed and stable  Last Vitals:  Vitals Value Taken Time  BP 87/47 12/30/22 1140  Temp 36.2 C 12/30/22 1139  Pulse 75 12/30/22 1142  Resp 12 12/30/22 1142  SpO2 100 % 12/30/22 1142  Vitals shown include unfiled device data.  Last Pain:  Vitals:   12/30/22 1139  TempSrc: Temporal  PainSc:          Complications: No notable events documented.

## 2022-12-30 NOTE — Anesthesia Postprocedure Evaluation (Signed)
Anesthesia Post Note  Patient: Richard Duarte  Procedure(s) Performed: COLONOSCOPY WITH PROPOFOL     Patient location during evaluation: PACU Anesthesia Type: MAC Level of consciousness: awake and alert Pain management: pain level controlled Vital Signs Assessment: post-procedure vital signs reviewed and stable Respiratory status: spontaneous breathing, nonlabored ventilation, respiratory function stable and patient connected to nasal cannula oxygen Cardiovascular status: stable and blood pressure returned to baseline Postop Assessment: no apparent nausea or vomiting Anesthetic complications: no  No notable events documented.  Last Vitals:  Vitals:   12/30/22 1224 12/30/22 1300  BP: 131/77 (!) 110/48  Pulse: 83 84  Resp:    Temp:    SpO2: (!) 83% 99%    Last Pain:  Vitals:   12/30/22 1200  TempSrc:   PainSc: 0-No pain                 Shelton Silvas

## 2022-12-30 NOTE — Plan of Care (Signed)
  Problem: Clinical Measurements: Goal: Diagnostic test results will improve Outcome: Progressing   Problem: Clinical Measurements: Goal: Respiratory complications will improve Outcome: Progressing   Problem: Clinical Measurements: Goal: Cardiovascular complication will be avoided Outcome: Progressing   Problem: Activity: Goal: Risk for activity intolerance will decrease Outcome: Progressing   Problem: Nutrition: Goal: Adequate nutrition will be maintained Outcome: Progressing   Problem: Coping: Goal: Level of anxiety will decrease Outcome: Progressing   Problem: Elimination: Goal: Will not experience complications related to urinary retention Outcome: Progressing   Problem: Pain Management: Goal: General experience of comfort will improve Outcome: Progressing   Problem: Safety: Goal: Ability to remain free from injury will improve Outcome: Progressing

## 2022-12-30 NOTE — Progress Notes (Signed)
Rec'd call from Dr Margo Aye of [facility].  Patient continues to pass BRBPR with clots and has become pale with SBP dropping from 150's last evening to 120's now.   Patient rec'd a unit of PRBCs and will receive another. I advised a repeat CTA abdomen with involvement of IR if positive, per Dr. Marvell Fuller consult note. Dr. Margo Aye informed me that it was just completed, no report yet.  I advised a call to radiology if no report within the next 30 minutes.  No further bowel prep.  Not clear if colonoscopy will be needed and feasible at this point, but patient should be NPO in case interventional procedure required.  Ellwood Dense, MD

## 2022-12-31 ENCOUNTER — Telehealth (HOSPITAL_COMMUNITY): Payer: Self-pay | Admitting: Pharmacy Technician

## 2022-12-31 DIAGNOSIS — K922 Gastrointestinal hemorrhage, unspecified: Secondary | ICD-10-CM | POA: Diagnosis not present

## 2022-12-31 LAB — CBC WITH DIFFERENTIAL/PLATELET
Abs Immature Granulocytes: 0.07 10*3/uL (ref 0.00–0.07)
Basophils Absolute: 0 10*3/uL (ref 0.0–0.1)
Basophils Relative: 0 %
Eosinophils Absolute: 0.1 10*3/uL (ref 0.0–0.5)
Eosinophils Relative: 1 %
HCT: 23.6 % — ABNORMAL LOW (ref 39.0–52.0)
Hemoglobin: 8.1 g/dL — ABNORMAL LOW (ref 13.0–17.0)
Immature Granulocytes: 1 %
Lymphocytes Relative: 23 %
Lymphs Abs: 1.6 10*3/uL (ref 0.7–4.0)
MCH: 30.6 pg (ref 26.0–34.0)
MCHC: 34.3 g/dL (ref 30.0–36.0)
MCV: 89.1 fL (ref 80.0–100.0)
Monocytes Absolute: 0.7 10*3/uL (ref 0.1–1.0)
Monocytes Relative: 10 %
Neutro Abs: 4.3 10*3/uL (ref 1.7–7.7)
Neutrophils Relative %: 65 %
Platelets: 188 10*3/uL (ref 150–400)
RBC: 2.65 MIL/uL — ABNORMAL LOW (ref 4.22–5.81)
RDW: 14.4 % (ref 11.5–15.5)
WBC: 6.8 10*3/uL (ref 4.0–10.5)
nRBC: 0 % (ref 0.0–0.2)

## 2022-12-31 LAB — BASIC METABOLIC PANEL
Anion gap: 8 (ref 5–15)
BUN: 11 mg/dL (ref 8–23)
CO2: 23 mmol/L (ref 22–32)
Calcium: 7.8 mg/dL — ABNORMAL LOW (ref 8.9–10.3)
Chloride: 105 mmol/L (ref 98–111)
Creatinine, Ser: 1.21 mg/dL (ref 0.61–1.24)
GFR, Estimated: >60 mL/min (ref >60)
Glucose, Bld: 116 mg/dL — ABNORMAL HIGH (ref 70–99)
Potassium: 4 mmol/L (ref 3.5–5.1)
Sodium: 136 mmol/L (ref 135–145)

## 2022-12-31 MED ORDER — ACETAMINOPHEN 325 MG PO TABS: 650.0000 mg | ORAL_TABLET | Freq: Four times a day (QID) | ORAL | Status: AC | PRN

## 2022-12-31 MED ORDER — PANTOPRAZOLE SODIUM 40 MG PO TBEC: 40.0000 mg | DELAYED_RELEASE_TABLET | Freq: Every day | ORAL | 1 refills | Status: AC
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
## 2022-12-31 NOTE — Progress Notes (Signed)
Patient and guest discharged in wheelchairs via this RN. Patient brought to ER parking lot and guest brought to parking dock. IV removed and all paperwork explained. Belongings with patient.

## 2022-12-31 NOTE — Telephone Encounter (Signed)
Pharmacy Patient Advocate Encounter  Received notification from [facility] that Prior Authorization for Pantoprazole Sodium 40MG  dr tablets has been APPROVED from 12/31/2022 to 12/31/2023   PA #/Case ID/Reference #: [id number] Key: [id number]
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
## 2022-12-31 NOTE — Plan of Care (Signed)

## 2023-01-01 ENCOUNTER — Encounter (HOSPITAL_COMMUNITY): Payer: Self-pay | Admitting: Gastroenterology

## 2023-01-02 ENCOUNTER — Other Ambulatory Visit: Payer: Self-pay

## 2023-01-02 ENCOUNTER — Observation Stay (HOSPITAL_COMMUNITY)
Admission: EM | Admit: 2023-01-02 | Discharge: 2023-01-03 | Disposition: A | Discharge status: Home / Self Care | Payer: BC Managed Care – PPO | Attending: Internal Medicine | Admitting: Internal Medicine

## 2023-01-02 ENCOUNTER — Encounter (HOSPITAL_COMMUNITY): Payer: Self-pay

## 2023-01-02 DIAGNOSIS — Z7984 Long term (current) use of oral hypoglycemic drugs: Secondary | ICD-10-CM | POA: Diagnosis not present

## 2023-01-02 DIAGNOSIS — I491 Atrial premature depolarization: Secondary | ICD-10-CM | POA: Diagnosis not present

## 2023-01-02 DIAGNOSIS — I493 Ventricular premature depolarization: Secondary | ICD-10-CM | POA: Diagnosis not present

## 2023-01-02 DIAGNOSIS — Z6841 Body Mass Index (BMI) 40.0 and over, adult: Secondary | ICD-10-CM | POA: Diagnosis not present

## 2023-01-02 DIAGNOSIS — D649 Anemia, unspecified: Principal | ICD-10-CM

## 2023-01-02 DIAGNOSIS — E039 Hypothyroidism, unspecified: Secondary | ICD-10-CM | POA: Insufficient documentation

## 2023-01-02 DIAGNOSIS — Z85850 Personal history of malignant neoplasm of thyroid: Secondary | ICD-10-CM | POA: Diagnosis not present

## 2023-01-02 DIAGNOSIS — R002 Palpitations: Secondary | ICD-10-CM | POA: Diagnosis not present

## 2023-01-02 DIAGNOSIS — I10 Essential (primary) hypertension: Secondary | ICD-10-CM | POA: Diagnosis not present

## 2023-01-02 DIAGNOSIS — Z79899 Other long term (current) drug therapy: Secondary | ICD-10-CM | POA: Diagnosis not present

## 2023-01-02 DIAGNOSIS — D62 Acute posthemorrhagic anemia: Principal | ICD-10-CM | POA: Diagnosis present

## 2023-01-02 LAB — T4, FREE: Free T4: 1.03 ng/dL (ref 0.61–1.12)

## 2023-01-02 LAB — COMPREHENSIVE METABOLIC PANEL
ALT: 22 U/L (ref 0–44)
AST: 24 U/L (ref 15–41)
Albumin: 3.3 g/dL — ABNORMAL LOW (ref 3.5–5.0)
Alkaline Phosphatase: 48 U/L (ref 38–126)
Anion gap: 12 (ref 5–15)
BUN: 11 mg/dL (ref 8–23)
CO2: 22 mmol/L (ref 22–32)
Calcium: 8.5 mg/dL — ABNORMAL LOW (ref 8.9–10.3)
Chloride: 105 mmol/L (ref 98–111)
Creatinine, Ser: 1.33 mg/dL — ABNORMAL HIGH (ref 0.61–1.24)
GFR, Estimated: 60 mL/min — ABNORMAL LOW (ref >60)
Glucose, Bld: 124 mg/dL — ABNORMAL HIGH (ref 70–99)
Potassium: 3.5 mmol/L (ref 3.5–5.1)
Sodium: 139 mmol/L (ref 135–145)
Total Bilirubin: 0.6 mg/dL (ref <1.2)
Total Protein: 5.3 g/dL — ABNORMAL LOW (ref 6.5–8.1)

## 2023-01-02 LAB — BPAM RBC
Blood Product Expiration Date: 202501082359
Blood Product Expiration Date: 202501232359
Blood Product Expiration Date: 202501232359
Blood Product Expiration Date: 202501072359
ISSUE DATE / TIME: 202412222317
ISSUE DATE / TIME: 202412230316
Unit Type and Rh: 600
Unit Type and Rh: 600
Unit Type and Rh: 202501082359
Unit Type and Rh: 600
Unit Type and Rh: 600

## 2023-01-02 LAB — TYPE AND SCREEN
ABO/RH(D): A NEG
Antibody Screen: NEGATIVE
Unit division: 0
Unit division: 0
Unit division: 0
Unit division: 0

## 2023-01-02 LAB — CBC WITH DIFFERENTIAL/PLATELET
Abs Immature Granulocytes: 0.1 10*3/uL — ABNORMAL HIGH (ref 0.00–0.07)
Basophils Absolute: 0 10*3/uL (ref 0.0–0.1)
Basophils Relative: 1 %
Eosinophils Absolute: 0.1 10*3/uL (ref 0.0–0.5)
Eosinophils Relative: 2 %
HCT: 22.3 % — ABNORMAL LOW (ref 39.0–52.0)
Hemoglobin: 7.4 g/dL — ABNORMAL LOW (ref 13.0–17.0)
Immature Granulocytes: 2 %
Lymphocytes Relative: 30 %
Lymphs Abs: 1.4 10*3/uL (ref 0.7–4.0)
MCH: 30.7 pg (ref 26.0–34.0)
MCHC: 33.2 g/dL (ref 30.0–36.0)
MCV: 92.5 fL (ref 80.0–100.0)
Monocytes Absolute: 0.3 10*3/uL (ref 0.1–1.0)
Monocytes Relative: 7 %
Neutro Abs: 2.8 10*3/uL (ref 1.7–7.7)
Neutrophils Relative %: 58 %
Platelets: 165 10*3/uL (ref 150–400)
RBC: 2.41 MIL/uL — ABNORMAL LOW (ref 4.22–5.81)
RDW: 14.4 % (ref 11.5–15.5)
WBC: 4.7 10*3/uL (ref 4.0–10.5)
nRBC: 0 % (ref 0.0–0.2)

## 2023-01-02 LAB — PREPARE RBC (CROSSMATCH)

## 2023-01-02 LAB — TSH: TSH: 14.662 u[IU]/mL — ABNORMAL HIGH (ref 0.350–4.500)

## 2023-01-02 LAB — FERRITIN: Ferritin: 57 ng/mL (ref 24–336)

## 2023-01-02 LAB — TROPONIN I (HIGH SENSITIVITY)
Troponin I (High Sensitivity): 5 ng/L (ref <18)
Troponin I (High Sensitivity): 9 ng/L (ref <18)

## 2023-01-02 LAB — IRON AND TIBC
Iron: 37 ug/dL — ABNORMAL LOW (ref 45–182)
Saturation Ratios: 10 % — ABNORMAL LOW (ref 17.9–39.5)
TIBC: 386 ug/dL (ref 250–450)
UIBC: 349 ug/dL

## 2023-01-02 LAB — PROTIME-INR
INR: 1 (ref 0.8–1.2)
Prothrombin Time: 13.1 s (ref 11.4–15.2)

## 2023-01-02 LAB — MAGNESIUM: Magnesium: 1.6 mg/dL — ABNORMAL LOW (ref 1.7–2.4)

## 2023-01-02 LAB — POC OCCULT BLOOD, ED: Fecal Occult Bld: POSITIVE — AB

## 2023-01-02 LAB — GLUCOSE, CAPILLARY: Glucose-Capillary: 85 mg/dL (ref 70–99)

## 2023-01-02 LAB — VITAMIN B12: Vitamin B-12: 603 pg/mL (ref 180–914)

## 2023-01-02 LAB — ETHANOL: Alcohol, Ethyl (B): <10 mg/dL (ref <10)

## 2023-01-02 LAB — PHOSPHORUS: Phosphorus: 3.9 mg/dL (ref 2.5–4.6)

## 2023-01-02 MED ORDER — SENNOSIDES-DOCUSATE SODIUM 8.6-50 MG PO TABS: 1.0000 | ORAL_TABLET | Freq: Every evening | ORAL | Status: DC | PRN

## 2023-01-02 MED ORDER — HYDROCHLOROTHIAZIDE 12.5 MG PO TABS
12.5000 mg | ORAL_TABLET | Freq: Every day | ORAL | Status: DC
  Administered 2023-01-03: 12.5 mg via ORAL
  Filled 2023-01-02: qty 1

## 2023-01-02 MED ORDER — MAGNESIUM 250 MG PO TABS: 1.0000 | ORAL_TABLET | ORAL | Status: DC

## 2023-01-02 MED ORDER — VITAMIN B-12 1000 MCG PO TABS
500.0000 ug | ORAL_TABLET | Freq: Every day | ORAL | Status: DC
  Administered 2023-01-02 – 2023-01-03 (×2): 500 ug via ORAL
  Filled 2023-01-02 (×2): qty 1

## 2023-01-02 MED ORDER — LIDOCAINE 5 % EX PTCH
1.0000 | MEDICATED_PATCH | CUTANEOUS | Status: DC
  Filled 2023-01-02: qty 1

## 2023-01-02 MED ORDER — OLMESARTAN MEDOXOMIL-HCTZ 40-12.5 MG PO TABS: 1.0000 | ORAL_TABLET | Freq: Every day | ORAL | Status: DC

## 2023-01-02 MED ORDER — ONDANSETRON HCL 4 MG/2ML IJ SOLN: 4.0000 mg | Freq: Four times a day (QID) | INTRAMUSCULAR | Status: DC | PRN

## 2023-01-02 MED ORDER — ACETAMINOPHEN 325 MG PO TABS: 650.0000 mg | ORAL_TABLET | Freq: Four times a day (QID) | ORAL | Status: DC | PRN

## 2023-01-02 MED ORDER — PANTOPRAZOLE SODIUM 40 MG PO TBEC
40.0000 mg | DELAYED_RELEASE_TABLET | Freq: Every day | ORAL | Status: DC
  Administered 2023-01-03: 40 mg via ORAL
  Filled 2023-01-02: qty 1

## 2023-01-02 MED ORDER — LEVOTHYROXINE SODIUM 100 MCG PO TABS
175.0000 ug | ORAL_TABLET | ORAL | Status: DC
  Administered 2023-01-03: 175 ug via ORAL
  Filled 2023-01-02: qty 1

## 2023-01-02 MED ORDER — ACETAMINOPHEN 650 MG RE SUPP: 650.0000 mg | Freq: Four times a day (QID) | RECTAL | Status: DC | PRN

## 2023-01-02 MED ORDER — ADULT MULTIVITAMIN W/MINERALS CH
1.0000 | ORAL_TABLET | Freq: Every day | ORAL | Status: DC
  Administered 2023-01-02 – 2023-01-03 (×2): 1 via ORAL
  Filled 2023-01-02 (×2): qty 1

## 2023-01-02 MED ORDER — MAGNESIUM OXIDE -MG SUPPLEMENT 400 (240 MG) MG PO TABS
200.0000 mg | ORAL_TABLET | ORAL | Status: DC
  Administered 2023-01-03: 200 mg via ORAL
  Filled 2023-01-02: qty 1

## 2023-01-02 MED ORDER — SODIUM CHLORIDE 0.9% IV SOLUTION: Freq: Once | INTRAVENOUS | Status: AC

## 2023-01-02 MED ORDER — ONDANSETRON HCL 4 MG PO TABS
4.0000 mg | ORAL_TABLET | Freq: Four times a day (QID) | ORAL | Status: DC | PRN
Start: 2023-01-02 — End: 2023-01-03

## 2023-01-02 MED ORDER — FOLIC ACID 1 MG PO TABS
1.0000 mg | ORAL_TABLET | Freq: Every day | ORAL | Status: DC
  Administered 2023-01-02 – 2023-01-03 (×2): 1 mg via ORAL
  Filled 2023-01-02 (×2): qty 1

## 2023-01-02 MED ORDER — LEVOTHYROXINE SODIUM 75 MCG PO TABS
150.0000 ug | ORAL_TABLET | ORAL | Status: DC
Start: 2023-01-04 — End: 2023-01-03

## 2023-01-02 MED ORDER — METFORMIN HCL ER 500 MG PO TB24
500.0000 mg | ORAL_TABLET | Freq: Every day | ORAL | Status: DC
  Administered 2023-01-03: 500 mg via ORAL
  Filled 2023-01-02 (×2): qty 1

## 2023-01-02 MED ORDER — IRBESARTAN 300 MG PO TABS
300.0000 mg | ORAL_TABLET | Freq: Every day | ORAL | Status: DC
  Administered 2023-01-03: 300 mg via ORAL
  Filled 2023-01-02: qty 1

## 2023-01-02 MED ORDER — CHLORHEXIDINE GLUCONATE CLOTH 2 % EX PADS
6.0000 | MEDICATED_PAD | Freq: Every day | CUTANEOUS | Status: DC
  Administered 2023-01-02 – 2023-01-03 (×2): 6 via TOPICAL

## 2023-01-02 MED ORDER — MAGNESIUM SULFATE 2 GM/50ML IV SOLN
2.0000 g | Freq: Once | INTRAVENOUS | Status: AC
  Administered 2023-01-02: 2 g via INTRAVENOUS
  Filled 2023-01-02: qty 50

## 2023-01-02 MED ORDER — ATORVASTATIN CALCIUM 40 MG PO TABS
40.0000 mg | ORAL_TABLET | Freq: Every day | ORAL | Status: DC
Start: 2023-01-02 — End: 2023-01-03
  Administered 2023-01-02 – 2023-01-03 (×2): 40 mg via ORAL
  Filled 2023-01-02 (×2): qty 1
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
## 2023-01-02 NOTE — ED Provider Notes (Signed)
[place] EMERGENCY DEPARTMENT AT [facility] Provider Note   CSN: [id number] Arrival date & time: 01/02/23  1324     History  Chief Complaint  Patient presents with   Hypertension   Palpitations   Headache    Richard Duarte is a 62 y.o. male.  HPI Patient was recently admitted for lower GI bleed and discharged 12\24\24.  Patient required 4 units packed red blood cells.  Patient colonoscopy 12\23 and found severe diverticulosis with extensive blood in the colon but no culprit lesion identified.  Patient reports that he felt all right when he went to bed last night.  He reports lying down however he is feeling palpitations.  He reports with the palpitations he is hearing whooshing of blood in his ears.  He reports he had a vague generalized headache and started taking his blood pressure.  Patient reports at home he was getting blood pressure readings of 170s over 140s.  Now, with blood pressures in the close to normal range in the emergency department, he is wondering if his blood pressure machine was dysfunctional.  Based on those readings, he had taken a blood pressure medication at 2 AM and got lower readings.  Patient denies he is having chest pain.  He reports symptoms of palpitations and whooshing are more pronounced in supine position.  He denies any significant swelling of the lower extremities.  Patient reports he has been drinking plenty of fluids and urinating.  He reports his stools look normal.  He has not noted black dark or bloody stool.    Home Medications Prior to Admission medications   Medication Sig Start Date End Date Taking? Authorizing Provider  acetaminophen (TYLENOL) 325 MG tablet Take 2 tablets (650 mg total) by mouth every 6 (six) hours as needed for mild pain (pain score 1-3) (or Fever >/= 101). Maximum dose 2 grams per day 12/31/22   Jonah Blue, MD  APPLE CIDER VINEGAR PO Take 450 mg by mouth 2 (two) times a week.    [provider]  Ascorbic Acid (VITAMIN C) 1000 MG tablet Take 1,000 mg by mouth as needed (vitamin).    [provider]  atorvastatin (LIPITOR) 40 MG tablet Take 1 tablet (40 mg total) by mouth daily. 10/26/19   Autry-Lott, Randa Evens, DO  Bacillus Coagulans-Inulin (PROBIOTIC FORMULA PO) Take 1 capsule by mouth daily.    [provider]  Cholecalciferol (VITAMIN D) 50 MCG (2000 UT) CAPS Take 1 capsule by mouth daily.    [provider]  Coenzyme Q10 100 MG TABS Take 200 mg by mouth 3 (three) times a week.    [provider]  Cyanocobalamin (VITAMIN B 12) 500 MCG TABS Take 1,000 mcg by mouth daily.    [provider]  Echinacea-Goldenseal (ECHINACEA COMB/GOLDEN SEAL PO) Take 900 mg by mouth daily.    [provider]  Garlic 1000 MG CAPS Take 1 capsule by mouth as needed.    [provider]  Ginger, Zingiber officinalis, (GINGER ROOT) 550 MG CAPS Take 1 capsule by mouth as needed.    [provider]  Ginkgo Biloba 60 MG TABS Take 1 tablet by mouth as needed (memory).    [provider]  Ginseng 100 MG CAPS Take 200 mg by mouth as needed (energy).    [provider]  levothyroxine (EUTHYROX) 175 MCG tablet Take 1 tablet (175 mcg total) by mouth daily before breakfast. Patient taking differently: Take 175 mcg by mouth every  other day. Alternating with 150 mcg dose 04/04/21   Tysinger, Kermit Balo, PA-C  levothyroxine (SYNTHROID) 150 MCG tablet Take 150 mcg by mouth every other day. Alternating with the 175 mcg dose    [provider]  loratadine (CLARITIN) 10 MG tablet Take 1 tablet (10 mg total) by mouth daily. Patient taking differently: Take 10 mg by mouth daily as needed for allergies or rhinitis. 04/30/19   Arlyce Harman, MD  Magnesium 250 MG TABS Take 1 tablet by mouth 3 (three) times a week.    [provider]  melatonin 5 MG TABS Take 5 mg by mouth daily.    [provider]  metFORMIN  (GLUCOPHAGE-XR) 500 MG 24 hr tablet Take 2 tablets (1,000 mg total) by mouth daily. Patient taking differently: Take 500 mg by mouth daily. 08/03/21   Tysinger, Kermit Balo, PA-C  Milk Thistle 1000 MG CAPS Take 1 capsule by mouth daily.    [provider]  olmesartan-hydrochlorothiazide (BENICAR HCT) 40-12.5 MG tablet Take 1 tablet by mouth daily. 12/06/22   [provider]  pantoprazole (PROTONIX) 40 MG tablet Take 1 tablet (40 mg total) by mouth daily. 12/31/22 12/31/23  Jonah Blue, MD  Potassium 99 MG TABS Take 1 tablet by mouth as needed (low potassium).    [provider]  Selenium 200 MCG CAPS Take 1 capsule by mouth as needed.    [provider]  ST JOHNS WORT PO Take 1 capsule by mouth as needed (Depression).    [provider]  Turmeric 500 MG TABS Take 1 tablet by mouth as needed.    [provider]  Valerian Root 500 MG CAPS Take 1 capsule by mouth daily.    [provider]  Zinc 50 MG CAPS Take 1 capsule by mouth as needed.    [provider]  Zn-Pyg Afri-Nettle-Saw Palmet (SAW PALMETTO COMPLEX PO) Take 300 mg by mouth daily.    [provider]      Allergies    Other, Isovue [iopamidol], Amoxicillin, Penicillins, and Keflex [cephalexin]    Review of Systems   Review of Systems  Physical Exam Updated Vital Signs BP (!) 147/70   Pulse 87   Temp 98 F (36.7 C) (Oral)   Resp 10   Ht 5\' 6"  (1.676 m)   Wt 113.4 kg   SpO2 100%   BMI 40.35 kg/m  Physical Exam Constitutional:      Comments: Alert nontoxic.  No respiratory distress at rest.  Mental status is clear.  HENT:     Mouth/Throat:     Pharynx: Oropharynx is clear.  Eyes:     Extraocular Movements: Extraocular movements intact.  Cardiovascular:     Rate and Rhythm: Normal rate.     Comments: Patient is regular rate.  There are occasional PACs that are visible on the monitor.  Rates are about 90s.  No PVCs noted.  No rub murmur  gallop. Pulmonary:     Effort: Pulmonary effort is normal.     Breath sounds: Normal breath sounds.  Abdominal:     General: There is no distension.     Palpations: Abdomen is soft.     Tenderness: There is no abdominal tenderness. There is no guarding.  Genitourinary:    Comments: Rectal exam: Stool is brown.  No visible blood or melena. Musculoskeletal:        General: No swelling or tenderness. Normal range of motion.  Skin:    General: Skin is  warm and dry.     Coloration: Skin is pale.  Neurological:     General: No focal deficit present.     Mental Status: He is oriented to person, place, and time.     Coordination: Coordination normal.  Psychiatric:        Mood and Affect: Mood normal.     ED Results / Procedures / Treatments   Labs (all labs ordered are listed, but only abnormal results are displayed) Labs Reviewed  COMPREHENSIVE METABOLIC PANEL - Abnormal; Notable for the following components:      Result Value   Glucose, Bld 124 (*)    Creatinine, Ser 1.33 (*)    Calcium 8.5 (*)    Total Protein 5.3 (*)    Albumin 3.3 (*)    GFR, Estimated 60 (*)    All other components within normal limits  CBC WITH DIFFERENTIAL/PLATELET - Abnormal; Notable for the following components:   RBC 2.41 (*)    Hemoglobin 7.4 (*)    HCT 22.3 (*)    Abs Immature Granulocytes 0.10 (*)    All other components within normal limits  MAGNESIUM - Abnormal; Notable for the following components:   Magnesium 1.6 (*)    All other components within normal limits  ETHANOL  PROTIME-INR  POC OCCULT BLOOD, ED  TYPE AND SCREEN  TROPONIN I (HIGH SENSITIVITY)  TROPONIN I (HIGH SENSITIVITY)    EKG EKG Interpretation Date/Time:  Thursday January 02 2023 09:44:10 EST Ventricular Rate:  98 PR Interval:  146 QRS Duration:  94 QT Interval:  384 QTC Calculation: 491 R Axis:   20  Text Interpretation: Sinus rhythm Atrial premature complexes Low voltage, precordial leads Borderline prolonged  QT interval agree, PACs new since previous, otherwise no sig change Confirmed by Arby Barrette [phone]) on 01/02/2023 11:44:44 AM  Radiology No results found.  Procedures Procedures   CRITICAL CARE Performed by: Arby Barrette   Total critical care time: 30 minutes  Critical care time was exclusive of separately billable procedures and treating other patients.  Critical care was necessary to treat or prevent imminent or life-threatening deterioration.  Critical care was time spent personally by me on the following activities: development of treatment plan with patient and/or surrogate as well as nursing, discussions with consultants, evaluation of patient's response to treatment, examination of patient, obtaining history from patient or surrogate, ordering and performing treatments and interventions, ordering and review of laboratory studies, ordering and review of radiographic studies, pulse oximetry and re-evaluation of patient's condition.  Medications Ordered in ED Medications  0.9 %  sodium chloride infusion (Manually program via Guardrails IV Fluids) (has no administration in time range)    ED Course/ Medical Decision Making/ A&P                                 Medical Decision Making Amount and/or Complexity of Data Reviewed Labs: ordered.  Risk Prescription drug management. Decision regarding hospitalization.   Patient presents as outlined.  He was experiencing palpitations and pulsatile tinnitus onset acutely yesterday evening.  Patient was just discharged after having significant anemia and GI bleed.  Patient denies he had any visible blood loss in his bowel movement.  No vomiting.  He has been trying to hydrate and eat at home.  Will proceed with diagnostic evaluation to include lab work for anemia, ACS, metabolic derangement.  Paradoxically, patient had measured his blood pressures to be significantly  elevated at home, although it sounds unlikely to be accurate  because numbers he was getting the 170s over 140s.  Took additional blood pressure medication in response to this.  EKG does show frequent PACs.  No acute ischemic appearance.  GFR remains normal, hemoglobin 7.4 down from 9 after inpatient transfusion.  Digital rectal exam shows brown stool.  Patient does not appear to be having extensive GI bleed to the extent he did on previous admission.  However, with significant drop in hemoglobin and previous extensive bleeding without identified focus, plan will be to admit for observation and transfuse blood for symptomatic anemia.        Final Clinical Impression(s) / ED Diagnoses Final diagnoses:  Symptomatic anemia  Heart palpitations  PAC (premature atrial contraction)    Rx / DC Orders ED Discharge Orders     None         Arby Barrette, MD 01/02/23 1343

## 2023-01-02 NOTE — ED Triage Notes (Signed)
Pt. Stated, I couldn't sleep all night due to headache off and on all night, some palpitations and my BP is running really high. I feel like I hear whoozing in my ears. I was here on Christmas eve for rectal bleeding and a blood transfusion.

## 2023-01-03 DIAGNOSIS — I491 Atrial premature depolarization: Secondary | ICD-10-CM

## 2023-01-03 DIAGNOSIS — R002 Palpitations: Secondary | ICD-10-CM | POA: Diagnosis not present

## 2023-01-03 DIAGNOSIS — Z7984 Long term (current) use of oral hypoglycemic drugs: Secondary | ICD-10-CM | POA: Diagnosis not present

## 2023-01-03 DIAGNOSIS — Z79899 Other long term (current) drug therapy: Secondary | ICD-10-CM | POA: Diagnosis not present

## 2023-01-03 DIAGNOSIS — Z85850 Personal history of malignant neoplasm of thyroid: Secondary | ICD-10-CM | POA: Diagnosis not present

## 2023-01-03 DIAGNOSIS — D649 Anemia, unspecified: Principal | ICD-10-CM

## 2023-01-03 DIAGNOSIS — E039 Hypothyroidism, unspecified: Secondary | ICD-10-CM | POA: Diagnosis not present

## 2023-01-03 DIAGNOSIS — D62 Acute posthemorrhagic anemia: Secondary | ICD-10-CM | POA: Diagnosis not present

## 2023-01-03 DIAGNOSIS — Z6841 Body Mass Index (BMI) 40.0 and over, adult: Secondary | ICD-10-CM | POA: Diagnosis not present

## 2023-01-03 DIAGNOSIS — I10 Essential (primary) hypertension: Secondary | ICD-10-CM | POA: Diagnosis not present

## 2023-01-03 LAB — BASIC METABOLIC PANEL
Anion gap: 13 (ref 5–15)
BUN: 10 mg/dL (ref 8–23)
CO2: 23 mmol/L (ref 22–32)
Calcium: 8.9 mg/dL (ref 8.9–10.3)
Chloride: 102 mmol/L (ref 98–111)
Creatinine, Ser: 1.29 mg/dL — ABNORMAL HIGH (ref 0.61–1.24)
GFR, Estimated: >60 mL/min (ref >60)
Glucose, Bld: 155 mg/dL — ABNORMAL HIGH (ref 70–99)
Potassium: 3.6 mmol/L (ref 3.5–5.1)
Sodium: 138 mmol/L (ref 135–145)

## 2023-01-03 LAB — BPAM RBC
Blood Product Expiration Date: 202501182359
Blood Product Expiration Date: 202501182359
ISSUE DATE / TIME: 202412261817
ISSUE DATE / TIME: 202412262209
Unit Type and Rh: 600
Unit Type and Rh: 600

## 2023-01-03 LAB — TYPE AND SCREEN
ABO/RH(D): A NEG
Antibody Screen: NEGATIVE
Unit division: 0
Unit division: 0

## 2023-01-03 LAB — MAGNESIUM: Magnesium: 1.9 mg/dL (ref 1.7–2.4)

## 2023-01-03 LAB — CBC
HCT: 31.7 % — ABNORMAL LOW (ref 39.0–52.0)
Hemoglobin: 10.9 g/dL — ABNORMAL LOW (ref 13.0–17.0)
MCH: 30.9 pg (ref 26.0–34.0)
MCHC: 34.4 g/dL (ref 30.0–36.0)
MCV: 89.8 fL (ref 80.0–100.0)
Platelets: 222 10*3/uL (ref 150–400)
RBC: 3.53 MIL/uL — ABNORMAL LOW (ref 4.22–5.81)
RDW: 15 % (ref 11.5–15.5)
WBC: 6.5 10*3/uL (ref 4.0–10.5)
nRBC: 0 % (ref 0.0–0.2)

## 2023-01-03 NOTE — Progress Notes (Signed)
Mobility Specialist Progress Note:    01/03/23 1057  Mobility  Activity Ambulated independently in hallway;Ambulated independently in room;Transferred from bed to chair  Level of Assistance Independent  Assistive Device None  Distance Ambulated (ft) 350 ft  Activity Response Tolerated well  Mobility Referral Yes  Mobility visit 1 Mobility  Mobility Specialist Start Time (ACUTE ONLY) 1045  Mobility Specialist Stop Time (ACUTE ONLY) 1055  Mobility Specialist Time Calculation (min) (ACUTE ONLY) 10 min   Pt received in bed, agreeable to mobility session. Ambulated in hallway with SV, pt required no AD and was independent. Tolerated well, asx throughout. Returned pt to room, sitting up in chair comfortably with all needs met.   Feliciana Rossetti Mobility Specialist Please contact via Special educational needs teacher or  Rehab office at [phone]

## 2023-01-03 NOTE — Plan of Care (Signed)
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
## 2023-01-06 LAB — HEMOGLOBIN A1C
Hgb A1c MFr Bld: 5.2 % (ref 4.8–5.6)
Mean Plasma Glucose: 103 mg/dL

## 2023-01-07 DIAGNOSIS — E782 Mixed hyperlipidemia: Secondary | ICD-10-CM | POA: Diagnosis not present

## 2023-01-07 DIAGNOSIS — E890 Postprocedural hypothyroidism: Secondary | ICD-10-CM | POA: Diagnosis not present

## 2023-01-07 DIAGNOSIS — D649 Anemia, unspecified: Secondary | ICD-10-CM | POA: Diagnosis not present

## 2023-01-14 DIAGNOSIS — I10 Essential (primary) hypertension: Secondary | ICD-10-CM | POA: Diagnosis not present

## 2023-01-14 DIAGNOSIS — E119 Type 2 diabetes mellitus without complications: Secondary | ICD-10-CM | POA: Diagnosis not present

## 2023-01-14 DIAGNOSIS — E890 Postprocedural hypothyroidism: Secondary | ICD-10-CM | POA: Diagnosis not present

## 2023-01-14 DIAGNOSIS — E782 Mixed hyperlipidemia: Secondary | ICD-10-CM | POA: Diagnosis not present

## 2023-01-28 DIAGNOSIS — S60212A Contusion of left wrist, initial encounter: Secondary | ICD-10-CM | POA: Diagnosis not present

## 2023-01-28 DIAGNOSIS — M25532 Pain in left wrist: Secondary | ICD-10-CM | POA: Diagnosis not present

## 2023-02-03 DIAGNOSIS — S60212A Contusion of left wrist, initial encounter: Secondary | ICD-10-CM | POA: Diagnosis not present

## 2023-02-03 DIAGNOSIS — M25532 Pain in left wrist: Secondary | ICD-10-CM | POA: Diagnosis not present

## 2023-02-03 DIAGNOSIS — M13832 Other specified arthritis, left wrist: Secondary | ICD-10-CM | POA: Diagnosis not present

## 2023-02-27 ENCOUNTER — Ambulatory Visit: Payer: BC Managed Care – PPO | Admitting: Physician Assistant

## 2023-03-11 ENCOUNTER — Telehealth: Payer: Self-pay | Admitting: *Deleted

## 2023-03-11 ENCOUNTER — Other Ambulatory Visit (INDEPENDENT_AMBULATORY_CARE_PROVIDER_SITE_OTHER)

## 2023-03-11 ENCOUNTER — Ambulatory Visit (INDEPENDENT_AMBULATORY_CARE_PROVIDER_SITE_OTHER): Payer: BC Managed Care – PPO | Admitting: Physician Assistant

## 2023-03-11 ENCOUNTER — Encounter: Payer: Self-pay | Admitting: Physician Assistant

## 2023-03-11 VITALS — BP 110/80 | HR 83 | Ht 66.0 in | Wt 244.1 lb

## 2023-03-11 DIAGNOSIS — F1090 Alcohol use, unspecified, uncomplicated: Secondary | ICD-10-CM | POA: Diagnosis not present

## 2023-03-11 DIAGNOSIS — Z1211 Encounter for screening for malignant neoplasm of colon: Secondary | ICD-10-CM

## 2023-03-11 DIAGNOSIS — Z7984 Long term (current) use of oral hypoglycemic drugs: Secondary | ICD-10-CM

## 2023-03-11 DIAGNOSIS — D509 Iron deficiency anemia, unspecified: Secondary | ICD-10-CM

## 2023-03-11 DIAGNOSIS — K5731 Diverticulosis of large intestine without perforation or abscess with bleeding: Secondary | ICD-10-CM

## 2023-03-11 DIAGNOSIS — E119 Type 2 diabetes mellitus without complications: Secondary | ICD-10-CM

## 2023-03-11 DIAGNOSIS — R197 Diarrhea, unspecified: Secondary | ICD-10-CM | POA: Diagnosis not present

## 2023-03-11 DIAGNOSIS — D649 Anemia, unspecified: Secondary | ICD-10-CM

## 2023-03-11 DIAGNOSIS — Z789 Other specified health status: Secondary | ICD-10-CM

## 2023-03-11 DIAGNOSIS — E118 Type 2 diabetes mellitus with unspecified complications: Secondary | ICD-10-CM

## 2023-03-11 LAB — CBC WITH DIFFERENTIAL/PLATELET
Basophils Absolute: 0 10*3/uL (ref 0.0–0.1)
Basophils Relative: 0.9 % (ref 0.0–3.0)
Eosinophils Absolute: 0.1 10*3/uL (ref 0.0–0.7)
Eosinophils Relative: 1.3 % (ref 0.0–5.0)
HCT: 42.6 % (ref 39.0–52.0)
Hemoglobin: 14 g/dL (ref 13.0–17.0)
Lymphocytes Relative: 27.6 % (ref 12.0–46.0)
Lymphs Abs: 1.5 10*3/uL (ref 0.7–4.0)
MCHC: 32.9 g/dL (ref 30.0–36.0)
MCV: 87.9 fl (ref 78.0–100.0)
Monocytes Absolute: 0.6 10*3/uL (ref 0.1–1.0)
Monocytes Relative: 10.8 % (ref 3.0–12.0)
Neutro Abs: 3.2 10*3/uL (ref 1.4–7.7)
Neutrophils Relative %: 59.4 % (ref 43.0–77.0)
Platelets: 204 10*3/uL (ref 150.0–400.0)
RBC: 4.85 Mil/uL (ref 4.22–5.81)
RDW: 14 % (ref 11.5–15.5)
WBC: 5.4 10*3/uL (ref 4.0–10.5)

## 2023-03-11 LAB — COMPREHENSIVE METABOLIC PANEL
ALT: 18 U/L (ref 0–53)
AST: 17 U/L (ref 0–37)
Albumin: 4.4 g/dL (ref 3.5–5.2)
Alkaline Phosphatase: 54 U/L (ref 39–117)
BUN: 32 mg/dL — ABNORMAL HIGH (ref 6–23)
CO2: 27 meq/L (ref 19–32)
Calcium: 9.3 mg/dL (ref 8.4–10.5)
Chloride: 107 meq/L (ref 96–112)
Creatinine, Ser: 1.54 mg/dL — ABNORMAL HIGH (ref 0.40–1.50)
GFR: 47.38 mL/min — ABNORMAL LOW (ref >60.00)
Glucose, Bld: 130 mg/dL — ABNORMAL HIGH (ref 70–99)
Potassium: 4.4 meq/L (ref 3.5–5.1)
Sodium: 144 meq/L (ref 135–145)
Total Bilirubin: 0.4 mg/dL (ref 0.2–1.2)
Total Protein: 6.8 g/dL (ref 6.0–8.3)

## 2023-03-11 LAB — IBC + FERRITIN
Ferritin: 12.1 ng/mL — ABNORMAL LOW (ref 22.0–322.0)
Iron: 59 ug/dL (ref 42–165)
Saturation Ratios: 13.3 % — ABNORMAL LOW (ref 20.0–50.0)
TIBC: 442.4 ug/dL (ref 250.0–450.0)
Transferrin: 316 mg/dL (ref 212.0–360.0)

## 2023-03-11 NOTE — Telephone Encounter (Signed)
-----   Message from Doree Albee sent at 03/11/2023  3:17 PM EST ----- I called and left a message for the patient and will send my chart message. Due to incomplete prep in the hospital Dr. Retia Passe would like to repeat outpatient colonoscopy at [facility] to exclude any other cause of patient's bleeding.  Can we please arrange that? He has DM, no blood thiner
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
## 2023-03-11 NOTE — Progress Notes (Signed)
 I reviewed the patient's chart and his case. Unfortunately his last colonoscopy was limited by "fair prep".  This means the colonoscopy was not adequate for screening purposes.  While he very likely had a diverticular bleed or bleeding related to NSAIDs, I would recommend doing another colonoscopy when he is able to, ensure an adequate prep and no other concerning pathology that could have bled (AVM, polyp, etc.).  There did not appear to be any high risk pathology noted during the exam but I would not recommend a repeat colonoscopy in 10 years given fair prep on his last exam.  Marchelle Folks can touch base with you further about this but if the patient is willing I think repeat colonoscopy is reasonable.

## 2023-03-11 NOTE — Patient Instructions (Addendum)
 Your provider has requested that you go to the basement level for lab work before leaving today. Press "B" on the elevator. The lab is located at the first door on the left as you exit the elevator.  Diverticulosis Diverticulosis is a condition that develops when small pouches (diverticula) form in the wall of the large intestine (colon). The colon is where water is absorbed and stool (feces) is formed. The pouches form when the inside layer of the colon pushes through weak spots in the outer layers of the colon. You may have a few pouches or many of them. The pouches usually do not cause problems unless they become inflamed or infected. When this happens, the condition is called diverticulitis- this is left lower quadrant pain, diarrhea, fever, chills, nausea or vomiting.  If this occurs please call the office or go to the hospital. Sometimes these patches without inflammation can also have painless bleeding associated with them, if this happens please call the office or go to the hospital. Preventing constipation and increasing fiber can help reduce diverticula and prevent complications. Even if you feel you have a high-fiber diet, suggest getting on Benefiber or Cirtracel 2 times daily.  FIBER SUPPLEMENT You can do metamucil or fibercon once or twice a day but if this causes gas/bloating please switch to Benefiber or Citracel.  Fiber is good for constipation/diarrhea/irritable bowel syndrome.  It can also help with weight loss and can help lower your bad cholesterol (LDL).  Please do 1 TBSP in the morning in water, coffee, or tea.  It can take up to a month before you can see a difference with your bowel movements.  It is cheapest from [employer], [employer], [employer].    We are starting you on Metformin to prevent or treat diabetes.  Metformin does NOT cause low blood sugars.   In order to create energy your cells need insulin and sugar but sometime your cells do not accept the insulin and this can  cause increased sugars and decreased energy.   The Metformin helps your cells accept insulin and the sugar this help: 1) increase your energy  2) weight loss.    The two most common side effects are nausea and diarrhea, follow these rules to avoid it but these symptoms get better with time on the medication.    ALSO You can take imodium per box instructions when starting metformin if needed.   Rules of metformin: 1) start out slow with only one pill daily. Our goal for you is 4 pills a day or 2000mg  total.  2) take with your largest meal. 3) Take with least amount of carbs.   Call if you have any problems.

## 2023-03-11 NOTE — Telephone Encounter (Signed)
 Left message for patient to call back

## 2023-03-12 MED ORDER — SUFLAVE 178.7 G PO SOLR: 1.0000 | Freq: Once | ORAL | 0 refills | Status: AC

## 2023-03-12 NOTE — Telephone Encounter (Signed)
 Richard Duarte, see note telephone note dated 03/12/23. Patient scheduled for colonoscopy. He did ask that I relay to you that he is increasing his iron supplementation to daily dosing rather than every other day dosing. He just wanted to make you aware of this information.

## 2023-03-12 NOTE — Telephone Encounter (Signed)
 I have spoken to patient to advise that Dr Adela Lank recommends repeat colonoscopy as his last procedure only had a "fair" prep. Patient is agreeable to this plan and states he saw a recent note from Quentin Mulling, [place] indicating this information. Patient has scheduled colonoscopy on 05/08/23. Patient has been advised of time/date/location for upcoming procedure and has been given generalized verbal prep instructions. Discussed that a care partner 18 years or older should bring him, stay for the procedure and drive home due to sedation. Written instructions have been made available to the patient for additional review via mychart.

## 2023-03-17 DIAGNOSIS — E890 Postprocedural hypothyroidism: Secondary | ICD-10-CM | POA: Diagnosis not present

## 2023-03-18 DIAGNOSIS — S60212A Contusion of left wrist, initial encounter: Secondary | ICD-10-CM | POA: Diagnosis not present

## 2023-03-18 DIAGNOSIS — M19032 Primary osteoarthritis, left wrist: Secondary | ICD-10-CM | POA: Diagnosis not present

## 2023-03-18 NOTE — Telephone Encounter (Signed)
 Email received from [facility] requesting to change this patient's colonoscopy prep to [place] due to cost for patient; There are no contraindications to this change.  [facility] given permission via policy to change prep for patient;

## 2023-04-16 ENCOUNTER — Ambulatory Visit: Admitting: Orthopedic Surgery

## 2023-05-08 ENCOUNTER — Encounter: Admitting: Gastroenterology

## 2023-05-08 DIAGNOSIS — E782 Mixed hyperlipidemia: Secondary | ICD-10-CM | POA: Diagnosis not present

## 2023-05-08 DIAGNOSIS — E559 Vitamin D deficiency, unspecified: Secondary | ICD-10-CM | POA: Diagnosis not present

## 2023-05-08 DIAGNOSIS — I10 Essential (primary) hypertension: Secondary | ICD-10-CM | POA: Diagnosis not present

## 2023-05-08 DIAGNOSIS — E1169 Type 2 diabetes mellitus with other specified complication: Secondary | ICD-10-CM | POA: Diagnosis not present

## 2023-05-08 DIAGNOSIS — E890 Postprocedural hypothyroidism: Secondary | ICD-10-CM | POA: Diagnosis not present

## 2023-05-08 DIAGNOSIS — R790 Abnormal level of blood mineral: Secondary | ICD-10-CM | POA: Diagnosis not present

## 2023-05-08 DIAGNOSIS — Z125 Encounter for screening for malignant neoplasm of prostate: Secondary | ICD-10-CM | POA: Diagnosis not present

## 2023-05-12 DIAGNOSIS — Z0189 Encounter for other specified special examinations: Secondary | ICD-10-CM | POA: Diagnosis not present

## 2023-05-12 DIAGNOSIS — E782 Mixed hyperlipidemia: Secondary | ICD-10-CM | POA: Diagnosis not present

## 2023-05-12 DIAGNOSIS — R790 Abnormal level of blood mineral: Secondary | ICD-10-CM | POA: Diagnosis not present

## 2023-05-12 DIAGNOSIS — E1169 Type 2 diabetes mellitus with other specified complication: Secondary | ICD-10-CM | POA: Diagnosis not present

## 2023-05-12 DIAGNOSIS — I10 Essential (primary) hypertension: Secondary | ICD-10-CM | POA: Diagnosis not present

## 2023-05-19 LAB — PSA: PSA: 1.86

## 2023-05-29 ENCOUNTER — Encounter: Admitting: Gastroenterology

## 2023-06-16 ENCOUNTER — Ambulatory Visit: Admitting: Orthopedic Surgery

## 2023-07-17 DIAGNOSIS — E119 Type 2 diabetes mellitus without complications: Secondary | ICD-10-CM | POA: Diagnosis not present

## 2023-07-17 DIAGNOSIS — Z85850 Personal history of malignant neoplasm of thyroid: Secondary | ICD-10-CM | POA: Diagnosis not present

## 2023-07-17 DIAGNOSIS — E890 Postprocedural hypothyroidism: Secondary | ICD-10-CM | POA: Diagnosis not present

## 2023-08-12 DIAGNOSIS — E1169 Type 2 diabetes mellitus with other specified complication: Secondary | ICD-10-CM | POA: Diagnosis not present

## 2023-08-12 LAB — MICROALBUMIN, URINE: Microalb, Ur: 23.5

## 2023-08-12 LAB — HEMOGLOBIN A1C: Hemoglobin A1C: 5.5

## 2023-08-12 LAB — TSH: TSH: 1.95 (ref 0.41–5.90)

## 2023-11-07 DIAGNOSIS — E1169 Type 2 diabetes mellitus with other specified complication: Secondary | ICD-10-CM | POA: Diagnosis not present

## 2023-11-10 ENCOUNTER — Encounter: Payer: Self-pay | Admitting: Radiology

## 2023-11-12 DIAGNOSIS — K080 Exfoliation of teeth due to systemic causes: Secondary | ICD-10-CM | POA: Diagnosis not present

## 2023-11-27 ENCOUNTER — Encounter: Payer: Self-pay | Admitting: Family Medicine

## 2023-11-27 ENCOUNTER — Ambulatory Visit: Payer: Self-pay | Admitting: Family Medicine

## 2023-11-27 VITALS — BP 120/84 | HR 87 | Temp 98.2°F | Ht 66.0 in | Wt 242.0 lb

## 2023-11-27 DIAGNOSIS — E785 Hyperlipidemia, unspecified: Secondary | ICD-10-CM

## 2023-11-27 DIAGNOSIS — E1169 Type 2 diabetes mellitus with other specified complication: Secondary | ICD-10-CM | POA: Diagnosis not present

## 2023-11-27 DIAGNOSIS — N1831 Chronic kidney disease, stage 3a: Secondary | ICD-10-CM

## 2023-11-27 DIAGNOSIS — I10 Essential (primary) hypertension: Secondary | ICD-10-CM

## 2023-11-27 DIAGNOSIS — Z7689 Persons encountering health services in other specified circumstances: Secondary | ICD-10-CM

## 2023-11-27 DIAGNOSIS — E1122 Type 2 diabetes mellitus with diabetic chronic kidney disease: Secondary | ICD-10-CM

## 2023-11-27 DIAGNOSIS — E66812 Obesity, class 2: Secondary | ICD-10-CM

## 2023-11-27 DIAGNOSIS — I129 Hypertensive chronic kidney disease with stage 1 through stage 4 chronic kidney disease, or unspecified chronic kidney disease: Secondary | ICD-10-CM | POA: Diagnosis not present

## 2023-11-27 DIAGNOSIS — E890 Postprocedural hypothyroidism: Secondary | ICD-10-CM

## 2023-11-27 MED ORDER — EMPAGLIFLOZIN 25 MG PO TABS: 25.0000 mg | ORAL_TABLET | Freq: Every day | ORAL | 1 refills | Status: AC
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
## 2023-11-27 NOTE — Patient Instructions (Signed)

## 2023-11-28 LAB — CMP14+EGFR
ALT: 33 IU/L (ref 0–44)
AST: 26 IU/L (ref 0–40)
Albumin: 4.5 g/dL (ref 3.9–4.9)
Alkaline Phosphatase: 78 IU/L (ref 47–123)
BUN/Creatinine Ratio: 21 (ref 10–24)
BUN: 27 mg/dL (ref 8–27)
Bilirubin Total: 0.3 mg/dL (ref 0.0–1.2)
CO2: 18 mmol/L — ABNORMAL LOW (ref 20–29)
Calcium: 10.1 mg/dL (ref 8.6–10.2)
Chloride: 106 mmol/L (ref 96–106)
Creatinine, Ser: 1.3 mg/dL — ABNORMAL HIGH (ref 0.76–1.27)
Globulin, Total: 1.9 g/dL (ref 1.5–4.5)
Glucose: 117 mg/dL — ABNORMAL HIGH (ref 70–99)
Potassium: 4.8 mmol/L (ref 3.5–5.2)
Sodium: 141 mmol/L (ref 134–144)
Total Protein: 6.4 g/dL (ref 6.0–8.5)
eGFR: 61 mL/min/1.73 (ref >59)

## 2023-11-28 LAB — CBC
Hematocrit: 45 % (ref 37.5–51.0)
Hemoglobin: 14.9 g/dL (ref 13.0–17.7)
MCH: 29.7 pg (ref 26.6–33.0)
MCHC: 33.1 g/dL (ref 31.5–35.7)
MCV: 90 fL (ref 79–97)
Platelets: 224 x10E3/uL (ref 150–450)
RBC: 5.02 x10E6/uL (ref 4.14–5.80)
RDW: 13.1 % (ref 11.6–15.4)
WBC: 6.2 x10E3/uL (ref 3.4–10.8)

## 2023-11-28 LAB — LIPID PANEL
Chol/HDL Ratio: 3.1 ratio (ref 0.0–5.0)
Cholesterol, Total: 154 mg/dL (ref 100–199)
HDL: 50 mg/dL (ref >39)
LDL Chol Calc (NIH): 78 mg/dL (ref 0–99)
Triglycerides: 148 mg/dL (ref 0–149)
VLDL Cholesterol Cal: 26 mg/dL (ref 5–40)

## 2023-11-28 LAB — HEMOGLOBIN A1C
Est. average glucose Bld gHb Est-mCnc: 114 mg/dL
Hgb A1c MFr Bld: 5.6 % (ref 4.8–5.6)

## 2023-12-07 DIAGNOSIS — E1169 Type 2 diabetes mellitus with other specified complication: Secondary | ICD-10-CM | POA: Diagnosis not present

## 2023-12-11 ENCOUNTER — Ambulatory Visit: Payer: Self-pay | Admitting: Family Medicine

## 2023-12-11 NOTE — Assessment & Plan Note (Signed)
 History of thyroid  cancer, on Levothyroxine 

## 2023-12-11 NOTE — Progress Notes (Signed)
 Kidney function is stable , electrolytes are normal.Blood count is normal. A1c is 5.6, great and cholesterol levels are great. Continue taking meds as prescribed.  Thanks

## 2023-12-11 NOTE — Assessment & Plan Note (Signed)
 Continue statin therapy

## 2023-12-19 ENCOUNTER — Ambulatory Visit

## 2024-03-31 ENCOUNTER — Ambulatory Visit: Payer: Self-pay | Admitting: Family Medicine
